# Patient Record
Sex: Male | Born: 1980
Health system: Southern US, Community
[De-identification: ages and names within clinical notes are randomized; demographics above are authoritative.]

---

## 2012-05-01 ENCOUNTER — Encounter: Payer: Self-pay | Admitting: Physician Assistant

## 2012-05-01 ENCOUNTER — Ambulatory Visit (INDEPENDENT_AMBULATORY_CARE_PROVIDER_SITE_OTHER): Payer: BC Managed Care – PPO | Admitting: Physician Assistant

## 2012-05-01 VITALS — BP 124/73 | HR 86 | Ht 69.0 in | Wt 253.0 lb

## 2012-05-01 DIAGNOSIS — Z7189 Other specified counseling: Secondary | ICD-10-CM

## 2012-05-01 DIAGNOSIS — Z716 Tobacco abuse counseling: Secondary | ICD-10-CM

## 2012-05-01 DIAGNOSIS — F17200 Nicotine dependence, unspecified, uncomplicated: Secondary | ICD-10-CM

## 2012-05-01 MED ORDER — BUPROPION HCL ER (SR) 150 MG PO TB12: 150.0000 mg | ORAL_TABLET | Freq: Two times a day (BID) | ORAL | Status: DC

## 2012-05-01 NOTE — Progress Notes (Signed)
  Subjective:    Patient ID: Brandon Baker, male    DOB: 03-29-1981, 31 y.o.   MRN: [id number]  HPI Patient is a 31 year old male who presents to the clinic to establish care. Past medical history was reviewed and negative for any ongoing chronic illnesses. He currently does not take any medication. He is unaware when his last tetanus shot was. He reports that he recently was seen by the doctor or physical and have fasting labs were normal. Surgical history was reviewed and negative for any past surgeries. He does have an extensive family history of high cholesterol, high blood pressure, diabetes and heart attack. He does admit to smoking a pack a day for the last 17 years along with chewing on a regular basis. He does want to talk about smoking cessation today. Patient has tried Chantix and did not like the medication nor do to help him stop smoking. He has also tried nicotine gum to find the headache to chew gum and hard to remember to use. She did stop smoking once for 3 months and cravings or so bad he started back again.   Review of Systems     Objective:   Physical Exam  Constitutional: He is oriented to person, place, and time. He appears well-developed and well-nourished.  HENT:  Head: Normocephalic and atraumatic.  Cardiovascular: Normal rate, regular rhythm and normal heart sounds.   Pulmonary/Chest: Effort normal and breath sounds normal. He has no wheezes.  Neurological: He is alert and oriented to person, place, and time.  Skin: Skin is warm and dry.  Psychiatric: He has a normal mood and affect. His behavior is normal.          Assessment & Plan:  Smoking cessation counseling- discuss with patient the importance of stopping smoking. Gave patient a handout on benefits of smoking cessation over time. We talked about B6 and switching from gross cigarettes to them. We also started on Wellbutrin to see if we could help with cravings and self control. We discussed the oral fixation  of smoking and how to replace it with something healthier. Encouraged patient to set small hard to feasible goals to obtain. Patient is aware that Wellbutrin is also use of antidepressant and if he notices any mood changes to call office and stop medication immediately. We did start him on 1 tab a day for 3 days then increase to twice a day. Patient aware that Wellbutrin does take some time to get into the system effectively and therapeutically at 4-6 weeks. Patient is to followup in office in 2 months. Short-term goal is to switch from cigarettes to e-cigs and 2 decrease have to number of times making a day. He will not start to eliminate you until cigarettes are completely gone.

## 2012-05-01 NOTE — Patient Instructions (Signed)
Smoking Cessation Quitting smoking is important to your health and has many advantages. However, it is not always easy to quit since nicotine is a very addictive drug. Often times, people try 3 times or more before being able to quit. This document explains the best ways for you to prepare to quit smoking. Quitting takes hard work and a lot of effort, but you can do it. ADVANTAGES OF QUITTING SMOKING  You will live longer, feel better, and live better.  Your body will feel the impact of quitting smoking almost immediately.  Within 20 minutes, blood pressure decreases. Your pulse returns to its normal level.  After 8 hours, carbon monoxide levels in the blood return to normal. Your oxygen level increases.  After 24 hours, the chance of having a heart attack starts to decrease. Your breath, hair, and body stop smelling like smoke.  After 48 hours, damaged nerve endings begin to recover. Your sense of taste and smell improve.  After 72 hours, the body is virtually free of nicotine. Your bronchial tubes relax and breathing becomes easier.  After 2 to 12 weeks, lungs can hold more air. Exercise becomes easier and circulation improves.  The risk of having a heart attack, stroke, cancer, or lung disease is greatly reduced.  After 1 year, the risk of coronary heart disease is cut in half.  After 5 years, the risk of stroke falls to the same as a nonsmoker.  After 10 years, the risk of lung cancer is cut in half and the risk of other cancers decreases significantly.  After 15 years, the risk of coronary heart disease drops, usually to the level of a nonsmoker.  If you are pregnant, quitting smoking will improve your chances of having a healthy baby.  The people you live with, especially any children, will be healthier.  You will have extra money to spend on things other than cigarettes. QUESTIONS TO THINK ABOUT BEFORE ATTEMPTING TO QUIT You may want to talk about your answers with your  caregiver.  Why do you want to quit?  If you tried to quit in the past, what helped and what did not?  What will be the most difficult situations for you after you quit? How will you plan to handle them?  Who can help you through the tough times? Your family? Friends? A caregiver?  What pleasures do you get from smoking? What ways can you still get pleasure if you quit? Here are some questions to ask your caregiver:  How can you help me to be successful at quitting?  What medicine do you think would be best for me and how should I take it?  What should I do if I need more help?  What is smoking withdrawal like? How can I get information on withdrawal? GET READY  Set a quit date.  Change your environment by getting rid of all cigarettes, ashtrays, matches, and lighters in your home, car, or work. Do not let people smoke in your home.  Review your past attempts to quit. Think about what worked and what did not. GET SUPPORT AND ENCOURAGEMENT You have a better chance of being successful if you have help. You can get support in many ways.  Tell your family, friends, and co-workers that you are going to quit and need their support. Ask them not to smoke around you.  Get individual, group, or telephone counseling and support. Programs are available at local hospitals and health centers. Call your local health department for   information about programs in your area.  Spiritual beliefs and practices may help some smokers quit.  Download a "quit meter" on your computer to keep track of quit statistics, such as how long you have gone without smoking, cigarettes not smoked, and money saved.  Get a self-help book about quitting smoking and staying off of tobacco. LEARN NEW SKILLS AND BEHAVIORS  Distract yourself from urges to smoke. Talk to someone, go for a walk, or occupy your time with a task.  Change your normal routine. Take a different route to work. Drink tea instead of coffee.  Eat breakfast in a different place.  Reduce your stress. Take a hot bath, exercise, or read a book.  Plan something enjoyable to do every day. Reward yourself for not smoking.  Explore interactive web-based programs that specialize in helping you quit. GET MEDICINE AND USE IT CORRECTLY Medicines can help you stop smoking and decrease the urge to smoke. Combining medicine with the above behavioral methods and support can greatly increase your chances of successfully quitting smoking.  Nicotine replacement therapy helps deliver nicotine to your body without the negative effects and risks of smoking. Nicotine replacement therapy includes nicotine gum, lozenges, inhalers, nasal sprays, and skin patches. Some may be available over-the-counter and others require a prescription.  Antidepressant medicine helps people abstain from smoking, but how this works is unknown. This medicine is available by prescription.  Nicotinic receptor partial agonist medicine simulates the effect of nicotine in your brain. This medicine is available by prescription. Ask your caregiver for advice about which medicines to use and how to use them based on your health history. Your caregiver will tell you what side effects to look out for if you choose to be on a medicine or therapy. Carefully read the information on the package. Do not use any other product containing nicotine while using a nicotine replacement product.  RELAPSE OR DIFFICULT SITUATIONS Most relapses occur within the first 3 months after quitting. Do not be discouraged if you start smoking again. Remember, most people try several times before finally quitting. You may have symptoms of withdrawal because your body is used to nicotine. You may crave cigarettes, be irritable, feel very hungry, cough often, get headaches, or have difficulty concentrating. The withdrawal symptoms are only temporary. They are strongest when you first quit, but they will go away within  10 14 days. To reduce the chances of relapse, try to:  Avoid drinking alcohol. Drinking lowers your chances of successfully quitting.  Reduce the amount of caffeine you consume. Once you quit smoking, the amount of caffeine in your body increases and can give you symptoms, such as a rapid heartbeat, sweating, and anxiety.  Avoid smokers because they can make you want to smoke.  Do not let weight gain distract you. Many smokers will gain weight when they quit, usually less than 10 pounds. Eat a healthy diet and stay active. You can always lose the weight gained after you quit.  Find ways to improve your mood other than smoking. FOR MORE INFORMATION  www.smokefree.gov  Document Released: 04/16/2001 Document Revised: 10/22/2011 Document Reviewed: 08/01/2011 [employer] Patient Information 2013 [employer].  

## 2012-05-07 ENCOUNTER — Encounter: Payer: Self-pay | Admitting: *Deleted

## 2012-05-07 ENCOUNTER — Telehealth: Payer: Self-pay | Admitting: Physician Assistant

## 2012-05-07 NOTE — Telephone Encounter (Signed)
Let patient know that there was no documentation of up to date Tdap or lipid profile. At next visit would like to get fasting labs. Last labs were done in 2012.

## 2012-05-07 NOTE — Telephone Encounter (Signed)
Pt.notified

## 2012-08-07 ENCOUNTER — Ambulatory Visit (INDEPENDENT_AMBULATORY_CARE_PROVIDER_SITE_OTHER): Payer: BC Managed Care – PPO | Admitting: Physician Assistant

## 2012-08-07 ENCOUNTER — Encounter: Payer: Self-pay | Admitting: Physician Assistant

## 2012-08-07 VITALS — BP 118/79 | HR 82 | Temp 97.7°F | Wt 269.0 lb

## 2012-08-07 DIAGNOSIS — F17200 Nicotine dependence, unspecified, uncomplicated: Secondary | ICD-10-CM

## 2012-08-07 DIAGNOSIS — Z3009 Encounter for other general counseling and advice on contraception: Secondary | ICD-10-CM

## 2012-08-07 DIAGNOSIS — J069 Acute upper respiratory infection, unspecified: Secondary | ICD-10-CM

## 2012-08-07 DIAGNOSIS — Z720 Tobacco use: Secondary | ICD-10-CM | POA: Insufficient documentation

## 2012-08-07 MED ORDER — NICOTINE 21 MG/24HR TD PT24: 1.0000 | MEDICATED_PATCH | TRANSDERMAL | Status: DC

## 2012-08-07 MED ORDER — AMOXICILLIN-POT CLAVULANATE 875-125 MG PO TABS: 1.0000 | ORAL_TABLET | Freq: Two times a day (BID) | ORAL | Status: DC

## 2012-08-07 MED ORDER — NICOTINE POLACRILEX 4 MG MT LOZG: 4.0000 mg | LOZENGE | OROMUCOSAL | Status: DC | PRN

## 2012-08-07 MED ORDER — NICOTINE 7 MG/24HR TD PT24: 1.0000 | MEDICATED_PATCH | TRANSDERMAL | Status: DC

## 2012-08-07 MED ORDER — NICOTINE 14 MG/24HR TD PT24: 1.0000 | MEDICATED_PATCH | TRANSDERMAL | Status: DC

## 2012-08-07 NOTE — Patient Instructions (Addendum)
Mucinex d twice a day drinking lots of water. Afrin 3-5 nasal spray.  Vitamin C and Zinc  Upper Respiratory Infection, Adult An upper respiratory infection (URI) is also known as the common cold. It is often caused by a type of germ (virus). Colds are easily spread (contagious). You can pass it to others by kissing, coughing, sneezing, or drinking out of the same glass. Usually, you get better in 1 or 2 weeks.  HOME CARE   Only take medicine as told by your doctor.  Use a warm mist humidifier or breathe in steam from a hot shower.  Drink enough water and fluids to keep your pee (urine) clear or pale yellow.  Get plenty of rest.  Return to work when your temperature is back to normal or as told by your doctor. You may use a face mask and wash your hands to stop your cold from spreading. GET HELP RIGHT AWAY IF:   After the first few days, you feel you are getting worse.  You have questions about your medicine.  You have chills, shortness of breath, or brown or red spit (mucus).  You have yellow or brown snot (nasal discharge) or pain in the face, especially when you bend forward.  You have a fever, puffy (swollen) neck, pain when you swallow, or white spots in the back of your throat.  You have a bad headache, ear pain, sinus pain, or chest pain.  You have a high-pitched whistling sound when you breathe in and out (wheezing).  You have a lasting cough or cough up blood.  You have sore muscles or a stiff neck. MAKE SURE YOU:   Understand these instructions.  Will watch your condition.  Will get help right away if you are not doing well or get worse. Document Released: 10/09/2007 Document Revised: 07/15/2011 Document Reviewed: 08/27/2010 [facility] Patient Information 2013 [place], [place].

## 2012-08-07 NOTE — Progress Notes (Signed)
  Subjective:    Patient ID: Brandon Baker, male    DOB: 1981/04/21, 32 y.o.   MRN: [id number]  HPI Patient presents to the clinic with sinus pressure, sore throat, nasal congestion. Pt would also like to talk about smoking cessation ideas.   Pt has had sinus pressure, sore throat, nasal congestion for that last 5 days. STarted with nasal congestion but progressed into sinus pressure and sore throat this morning. Feels very achy and weak. Denies any fever, chills, n/v/d, itchy watering eyes, or ear pain. Still able to eat and drink without difficulty. Denies any cough.  Pt has tried chantix, wellbutrin, e-cigs, hyponosis and all have not worked. He is down to half a pack from one pack daily. He also chews tobacco also. He going to switch to sunflower seeds and wants rx for patch and lozgeners to try.   Needs referral to get vasectomy.       Review of Systems     Objective:   Physical Exam  Constitutional: He is oriented to person, place, and time. He appears well-developed and well-nourished.  HENT:  Head: Normocephalic and atraumatic.  Right Ear: External ear normal.  Left Ear: External ear normal.  TM's clear bilaterally.   Tenderness to palpation over frontal sinuses. Turbinates red and swollen bilaterally.  Oropharynx red with PND. Gums erythematous.  Eyes:  conjunctivia injected and watery discharge bilaterally.  Neck: Normal range of motion. Neck supple.  Cardiovascular: Normal rate, regular rhythm and normal heart sounds.   Pulmonary/Chest: Effort normal and breath sounds normal. He has no wheezes.  Lymphadenopathy:    He has no cervical adenopathy.  Neurological: He is alert and oriented to person, place, and time.  Skin: Skin is warm and dry.  Psychiatric: He has a normal mood and affect. His behavior is normal.          Assessment & Plan:  URI- discussed symptomatic treatment and gave handout. Talked about mucinex d and afrin for next 2-3 days. Gave rx for  augmentin if not improving after about 2-3 more days. Discussed going on anti-histamine for allergy symptoms. Talked about vitamin C and zinc for immune boost.   Tobacco abuse- gave rx for nicotrol patches all three doses and lozengers. Discussed a plan of decrease before starting.  Follow up in 2 months.   Need for vasectomy- will refer to urology.   Spent 30 minutes with patient and greater than 50 percent of visit was spent counseling pt regarding smoking cessation.

## 2012-08-10 ENCOUNTER — Telehealth: Payer: Self-pay | Admitting: *Deleted

## 2012-08-10 NOTE — Telephone Encounter (Signed)
Pharmacist calls and states because of insurance they need the frequency of the lozenges to be used. Also verify reason on the use of the Nicotine Patch in combo with the lozenges as the insurance does not recommend it

## 2012-08-14 ENCOUNTER — Ambulatory Visit: Payer: BC Managed Care – PPO | Admitting: Physician Assistant

## 2012-08-19 NOTE — Telephone Encounter (Signed)
Call Pt and see which one he would like to try first? Gum or patch.

## 2012-08-19 NOTE — Telephone Encounter (Signed)
LMOM of information and instructed pt to call back with which he prefers. Barry Dienes, LPN

## 2013-09-14 ENCOUNTER — Ambulatory Visit (INDEPENDENT_AMBULATORY_CARE_PROVIDER_SITE_OTHER): Payer: Federal, State, Local not specified - PPO | Admitting: Family Medicine

## 2013-09-14 ENCOUNTER — Encounter: Payer: Self-pay | Admitting: Family Medicine

## 2013-09-14 VITALS — BP 142/88 | HR 88 | Temp 98.2°F | Wt 258.0 lb

## 2013-09-14 DIAGNOSIS — J020 Streptococcal pharyngitis: Secondary | ICD-10-CM

## 2013-09-14 MED ORDER — CEFDINIR 300 MG PO CAPS: 300.0000 mg | ORAL_CAPSULE | Freq: Two times a day (BID) | ORAL | Status: AC

## 2013-09-14 NOTE — Progress Notes (Signed)
CC: Brandon Baker Digilio is a 33 y.o. male is here for Sore Throat   Subjective: HPI:  Complains of sore throat that is localized to the right posterior throat radiated into the right ear present when swallowing. Has been present since Thursday of last week. Fairly improved since starting amoxicillin on Saturday however fevers with maximum temperature of 102.0 have resolved as of Sunday morning. He was prescribed amoxicillin and a Medrol Dosepak which she is taking as prescribed. States that he feels tired, rundown, and surprised that he is not feeling much better by now.  No interventions other than above. No sick contacts. He denies choking, shortness of breath, cough, wheezing, facial pressure, runny nose, ocular complaints, hearing loss, tinnitus, rash, nor headache.   Review Of Systems Outlined In HPI  No past medical history on file.  No past surgical history on file. Family History  Problem Relation Age of Onset  . Alcohol abuse Mother   . Alcohol abuse Father   . Heart attack Maternal Uncle   . Hypertension Maternal Uncle   . Diabetes Maternal Grandmother   . Hyperlipidemia Maternal Grandmother   . Hypertension Maternal Grandmother   . Heart attack Maternal Grandfather   . Diabetes Maternal Grandfather   . Hyperlipidemia Maternal Grandfather   . Hypertension Maternal Grandfather     History   Social History  . Marital Status: Single    Spouse Name: N/A    Number of Children: N/A  . Years of Education: N/A   Occupational History  . Not on file.   Social History Main Topics  . Smoking status: Current Every Day Smoker -- 1.00 packs/day    Types: Cigarettes  . Smokeless tobacco: Not on file  . Alcohol Use: Not on file  . Drug Use: Not on file  . Sexual Activity: Not on file   Other Topics Concern  . Not on file   Social History Narrative  . No narrative on file     Objective: BP 142/88  Pulse 88  Temp(Src) 98.2 F (36.8 C) (Oral)  Wt 258 lb (117.028  kg)  General: Alert and Oriented, No Acute Distress HEENT: Pupils equal, round, reactive to light. Conjunctivae clear.  External ears unremarkable, canals clear with intact TMs with appropriate landmarks.  Middle ear appears open without effusion. Pink inferior turbinates.  Moist mucous membranes, pharynx with moderate erythema and moderate white exudate on the right tonsil, uvula is midline.  Neck supple without palpable lymphadenopathy nor abnormal masses. No posterior cervical chain lymphadenopathy Lungs: Clear to auscultation bilaterally, no wheezing/ronchi/rales.  Comfortable work of breathing. Good air movement. Extremities: No peripheral edema.  Strong peripheral pulses.  Mental Status: No depression, anxiety, nor agitation. Skin: Warm and dry.  Assessment & Plan: Lorin PicketScott was seen today for sore throat.  Diagnoses and associated orders for this visit:  Strep pharyngitis - cefdinir (OMNICEF) 300 MG capsule; Take 1 capsule (300 mg total) by mouth 2 (two) times daily.    Strep pharyngitis: Exam is consistent with antibiotic failure of amoxicillin for strep pharyngitis therefore stop this medication and start Omnicef instead, continue on Medrol Dosepak. Consider 800 mg ibuprofen every 8 hours for pain control.  Was given Rocephin 250 mg today in clinic to help expedite resolution.Signs and symptoms with emphasis on position of uvula requring emergent/urgent reevaluation were discussed with the patient.   Return if symptoms worsen or fail to improve.

## 2013-09-15 MED ORDER — CEFTRIAXONE SODIUM 1 G IJ SOLR
250.0000 mg | Freq: Once | INTRAMUSCULAR | Status: AC
  Administered 2013-09-15: 250 mg via INTRAMUSCULAR

## 2013-09-15 NOTE — Addendum Note (Signed)
Addended by: Avon GullyMCCRIMMON, Lorri Fukuhara C on: 09/15/2013 12:04 PM   Modules accepted: Orders

## 2014-05-30 ENCOUNTER — Encounter: Payer: Self-pay | Admitting: Physician Assistant

## 2014-05-30 ENCOUNTER — Ambulatory Visit (INDEPENDENT_AMBULATORY_CARE_PROVIDER_SITE_OTHER): Payer: Federal, State, Local not specified - PPO | Admitting: Physician Assistant

## 2014-05-30 VITALS — BP 153/92 | HR 97 | Temp 97.6°F | Wt 269.0 lb

## 2014-05-30 DIAGNOSIS — J069 Acute upper respiratory infection, unspecified: Secondary | ICD-10-CM

## 2014-05-30 MED ORDER — AMOXICILLIN-POT CLAVULANATE 875-125 MG PO TABS: 1.0000 | ORAL_TABLET | Freq: Two times a day (BID) | ORAL | Status: DC

## 2014-05-30 NOTE — Patient Instructions (Signed)
Consider staying on mucinex.  flonase 2 sprays.

## 2014-05-30 NOTE — Progress Notes (Signed)
   Subjective:    Patient ID: Brandon Baker, male    DOB: 10/02/1980, 34 y.o.   MRN: [id number]  HPI  Pt is a 34 yo male who presents to the clinic with 5 days of congestion, cough, ST, headache and ear pressure. Symptoms seem to be getting worse. Ran a low grade fever this weekend at 100. Cough is productive and worse at night. OTC decongestant, cough syrup and advil tylenol cold and flu.     Review of Systems  All other systems reviewed and are negative.      Objective:   Physical Exam  Constitutional: He is oriented to person, place, and time. He appears well-developed and well-nourished.  HENT:  Head: Normocephalic and atraumatic.  TM's erythematous with dullness to membrane. Not able to view ossicles. No active drainage or pus.   Maxillary pressure to palpation bilaterally.   Nasal turbinates red and swollen.   Oropharynx erythematous with PND, tonsils normal and without exudate.   Eyes: Conjunctivae are normal.  Neck: Normal range of motion. Neck supple.  Cardiovascular: Normal rate, regular rhythm and normal heart sounds.   Pulmonary/Chest: Effort normal and breath sounds normal. He has no wheezes.  Lymphadenopathy:    He has no cervical adenopathy.  Neurological: He is alert and oriented to person, place, and time.  Psychiatric: He has a normal mood and affect. His behavior is normal.          Assessment & Plan:  Acute  Upper respiratory infection- given augmentin for 10 days. Discussed delsym for cough. Can use mucinex to help loosen sputum. flonase for any nasal congestion. HO given. Follow up as needed.

## 2014-05-31 ENCOUNTER — Other Ambulatory Visit: Payer: Self-pay | Admitting: Physician Assistant

## 2014-05-31 ENCOUNTER — Telehealth: Payer: Self-pay | Admitting: *Deleted

## 2014-05-31 NOTE — Telephone Encounter (Signed)
Pt notified of recommendations

## 2014-05-31 NOTE — Telephone Encounter (Signed)
All prescription cough syrups have hydrocodone or codiene. This is on his allergy list. Best thing to try is honey and lemon juice compound or delsym cough syrup.

## 2014-05-31 NOTE — Telephone Encounter (Signed)
Pt left vm stating that he would like a cough syrup prescribed.  He stated that he didn't get much sleep last night.

## 2014-06-06 ENCOUNTER — Ambulatory Visit (INDEPENDENT_AMBULATORY_CARE_PROVIDER_SITE_OTHER): Payer: Federal, State, Local not specified - PPO | Admitting: Physician Assistant

## 2014-06-06 ENCOUNTER — Encounter: Payer: Self-pay | Admitting: Physician Assistant

## 2014-06-06 VITALS — BP 132/81 | HR 80 | Temp 97.8°F | Wt 269.0 lb

## 2014-06-06 DIAGNOSIS — J0110 Acute frontal sinusitis, unspecified: Secondary | ICD-10-CM

## 2014-06-06 MED ORDER — HYDROCODONE-HOMATROPINE 5-1.5 MG/5ML PO SYRP: 5.0000 mL | ORAL_SOLUTION | Freq: Every evening | ORAL | Status: DC | PRN

## 2014-06-06 MED ORDER — AZITHROMYCIN 250 MG PO TABS: ORAL_TABLET | ORAL | Status: DC

## 2014-06-06 MED ORDER — PREDNISONE 50 MG PO TABS: ORAL_TABLET | ORAL | Status: DC

## 2014-06-06 NOTE — Progress Notes (Signed)
   Subjective:    Patient ID: Brandon Baker, male    DOB: 04/13/1981, 34 y.o.   MRN: [id number]  HPI  Patient is a 34 year old male who was seen last week with an acute respiratory infection. He was given Augmentin and told to take Mucinex. He feels like his productive cough is improving but his frontal sinus pressure and dry cough is worsening. He coughs all day long but seems worse around 6:00 in the evening. He denies any wheezing or shortness of breath. He denies any fever, body aches or chills. He has a lot of head pressure and ear pain. He is starting to his Augmentin. He has continued on Mucinex past months as well as Robitussin.. Patient is a current smoker   Review of Systems  All other systems reviewed and are negative.      Objective:   Physical Exam  Constitutional: He is oriented to person, place, and time. He appears well-developed and well-nourished.  HENT:  Head: Normocephalic and atraumatic.  Right Ear: External ear normal.  Left Ear: External ear normal.  Left and right TMs appear slightly dull. No blood, pus or erythema.  Bilateral frontal sinus tenderness to palpation.  Oropharynx erythematous with no tonsillar swelling or exudate. There is some postnasal drip present.  Bilateral turbinates red and swollen bilaterally.  Eyes: Conjunctivae are normal. Right eye exhibits no discharge. Left eye exhibits no discharge.  Neck: Normal range of motion. Neck supple.  Cardiovascular: Normal rate, regular rhythm and normal heart sounds.   Pulmonary/Chest: Effort normal and breath sounds normal. He has no wheezes.  Lymphadenopathy:    He has no cervical adenopathy.  Neurological: He is alert and oriented to person, place, and time.  Skin: Skin is dry.  Psychiatric: He has a normal mood and affect. His behavior is normal.          Assessment & Plan:  Acute frontal sinusitis-with postnasal drip is causing a cough along with some postinflammatory causes. Discussed  contraindication with past allergy to Lortab to cough syrups. He would like to try. He is unsure if that is an actual allergy. Since Hycodan for bedtime use to pharmacy. Stop Augmentin. Start azithromycin. Start prednisone as well for 5 days. Call if not improving or symptoms worsening.

## 2014-11-24 ENCOUNTER — Ambulatory Visit (INDEPENDENT_AMBULATORY_CARE_PROVIDER_SITE_OTHER): Payer: Federal, State, Local not specified - PPO | Admitting: Family Medicine

## 2014-11-24 ENCOUNTER — Encounter: Payer: Self-pay | Admitting: Family Medicine

## 2014-11-24 VITALS — BP 138/81 | HR 82 | Wt 277.0 lb

## 2014-11-24 DIAGNOSIS — B880 Other acariasis: Secondary | ICD-10-CM | POA: Insufficient documentation

## 2014-11-24 MED ORDER — PREDNISONE 5 MG (48) PO TBPK: 5.0000 mg | ORAL_TABLET | Freq: Every day | ORAL | Status: DC

## 2014-11-24 MED ORDER — FLUOCINOLONE ACETONIDE 0.025 % EX OINT: TOPICAL_OINTMENT | Freq: Two times a day (BID) | CUTANEOUS | Status: DC

## 2014-11-24 NOTE — Progress Notes (Signed)
Brandon Baker is a 34 y.o. male who presents to [facility] [place]  today for rash. Patient has multiple small erythematous pruritic papules on both lower extremities. This is been present a few days after walking any grassy field. He thinks perhaps he has chigger bite. He has not tried any treatment yet. No fevers chills nausea vomiting or diarrhea. No new medications of detergents shampoos cosmetics etc.   No past medical history on file. No past surgical history on file. History  Substance Use Topics  . Smoking status: Current Every Day Smoker -- 1.00 packs/day    Types: Cigarettes  . Smokeless tobacco: Not on file  . Alcohol Use: Not on file   ROS as above Medications: Current Outpatient Prescriptions  Medication Sig Dispense Refill  . fluocinolone (SYNALAR) 0.025 % ointment Apply topically 2 (two) times daily. 60 g 1  . predniSONE (STERAPRED UNI-PAK 48 TAB) 5 MG (48) TBPK tablet Take 1 tablet (5 mg total) by mouth daily. 12 day dosepack po 48 tablet 0   No current facility-administered medications for this visit.   Allergies  Allergen Reactions  . Lortab [Hydrocodone-Acetaminophen] Other (See Comments)    Sweating and dizziness     Exam:  BP 138/81 mmHg  Pulse 82  Wt 277 lb (125.646 kg) Gen: Well NAD HEENT: EOMI,  MMM Lungs: Normal work of breathing. CTABL Heart: RRR no MRG Abd: NABS, Soft. Nondistended, Nontender Exts: Brisk capillary refill, warm and well perfused.  Skin: Small erythematous follicular papules both lower extremities. Blanchable. Nontender.  No results found for this or any previous visit (from the past 24 hour(s)). No results found.   Please see individual assessment and plan sections.

## 2014-11-24 NOTE — Patient Instructions (Signed)
Thank you for coming in today. Call or go to the emergency room if you get worse, have trouble breathing, have chest pains, or palpitations.  Use the ointment as needed.  Take prednisone if not better.   Insect Bite Mosquitoes, flies, fleas, bedbugs, and many other insects can bite. Insect bites are different from insect stings. A sting is when venom is injected into the skin. Some insect bites can transmit infectious diseases. SYMPTOMS  Insect bites usually turn red, swell, and itch for 2 to 4 days. They often go away on their own. TREATMENT  Your caregiver may prescribe antibiotic medicines if a bacterial infection develops in the bite. HOME CARE INSTRUCTIONS  Do not scratch the bite area.  Keep the bite area clean and dry. Wash the bite area thoroughly with soap and water.  Put ice or cool compresses on the bite area.  Put ice in a plastic bag.  Place a towel between your skin and the bag.  Leave the ice on for 20 minutes, 4 times a day for the first 2 to 3 days, or as directed.  You may apply a baking soda paste, cortisone cream, or calamine lotion to the bite area as directed by your caregiver. This can help reduce itching and swelling.  Only take over-the-counter or prescription medicines as directed by your caregiver.  If you are given antibiotics, take them as directed. Finish them even if you start to feel better. You may need a tetanus shot if:  You cannot remember when you had your last tetanus shot.  You have never had a tetanus shot.  The injury broke your skin. If you get a tetanus shot, your arm may swell, get red, and feel warm to the touch. This is common and not a problem. If you need a tetanus shot and you choose not to have one, there is a rare chance of getting tetanus. Sickness from tetanus can be serious. SEEK IMMEDIATE MEDICAL CARE IF:   You have increased pain, redness, or swelling in the bite area.  You see a red line on the skin coming from the  bite.  You have a fever.  You have joint pain.  You have a headache or neck pain.  You have unusual weakness.  You have a rash.  You have chest pain or shortness of breath.  You have abdominal pain, nausea, or vomiting.  You feel unusually tired or sleepy. MAKE SURE YOU:   Understand these instructions.  Will watch your condition.  Will get help right away if you are not doing well or get worse. Document Released: 05/30/2004 Document Revised: 07/15/2011 Document Reviewed: 11/21/2010 [facility] Patient Information 2015 [place], [place]. This information is not intended to replace advice given to you by your health care provider. Make sure you discuss any questions you have with your health care provider.

## 2014-11-24 NOTE — Assessment & Plan Note (Signed)
Likely chigger bites. Treat with steroid ointment and use prednisone if not better.

## 2015-07-29 ENCOUNTER — Emergency Department
Admission: EM | Admit: 2015-07-29 | Discharge: 2015-07-29 | Disposition: A | Discharge status: Home / Self Care | Payer: Federal, State, Local not specified - PPO | Source: Home / Self Care | Attending: Family Medicine | Admitting: Family Medicine

## 2015-07-29 DIAGNOSIS — M5442 Lumbago with sciatica, left side: Secondary | ICD-10-CM

## 2015-07-29 DIAGNOSIS — S39012A Strain of muscle, fascia and tendon of lower back, initial encounter: Secondary | ICD-10-CM

## 2015-07-29 MED ORDER — METHYLPREDNISOLONE SODIUM SUCC 40 MG IJ SOLR
80.0000 mg | Freq: Once | INTRAMUSCULAR | Status: AC
  Administered 2015-07-29: 80 mg via INTRAMUSCULAR

## 2015-07-29 MED ORDER — TRAMADOL HCL 50 MG PO TABS: 50.0000 mg | ORAL_TABLET | Freq: Four times a day (QID) | ORAL | Status: DC | PRN

## 2015-07-29 MED ORDER — METHOCARBAMOL 500 MG PO TABS: 500.0000 mg | ORAL_TABLET | Freq: Two times a day (BID) | ORAL | Status: DC

## 2015-07-29 MED ORDER — IBUPROFEN 600 MG PO TABS: 600.0000 mg | ORAL_TABLET | Freq: Four times a day (QID) | ORAL | Status: DC | PRN

## 2015-07-29 NOTE — ED Provider Notes (Signed)
CSN: [id number]     Arrival date & time 07/29/15  1428 History   First MD Initiated Contact with Patient 07/29/15 1531     Chief Complaint  Patient presents with  . Back Pain   (Consider location/radiation/quality/duration/timing/severity/associated sxs/prior Treatment) HPI The pt is a 35yo male presenting to [facility] with c/o sudden onset, waxing and waning Left lower back pain that started around 9AM this morning. Pt was trying to carry his toddler who "threw a fit" causing pt to injure his back.  Pain is aching and sharp, radiating to the lateral aspect of his Left upper thigh.  Denies numbness or tingling in legs or groin. Denies change in bowel or bladder habits. No prior hx of back surgeries or injuries.  He did take ibuprofen earlier but only provided minimal relief.  History reviewed. No pertinent past medical history. History reviewed. No pertinent past surgical history. Family History  Problem Relation Age of Onset  . Alcohol abuse Mother   . Alcohol abuse Father   . Heart attack Maternal Uncle   . Hypertension Maternal Uncle   . Diabetes Maternal Grandmother   . Hyperlipidemia Maternal Grandmother   . Hypertension Maternal Grandmother   . Heart attack Maternal Grandfather   . Diabetes Maternal Grandfather   . Hyperlipidemia Maternal Grandfather   . Hypertension Maternal Grandfather    Social History  Substance Use Topics  . Smoking status: Current Every Day Smoker -- 1.00 packs/day    Types: Cigarettes  . Smokeless tobacco: None  . Alcohol Use: None    Review of Systems  Constitutional: Negative for fever and chills.  Gastrointestinal: Negative for nausea, vomiting and abdominal pain.  Musculoskeletal: Positive for myalgias and back pain. Negative for joint swelling, arthralgias and gait problem.  Skin: Negative for color change, rash and wound.  Neurological: Negative for weakness and numbness.    Allergies  Lortab  Home Medications   Prior to Admission  medications   Medication Sig Start Date End Date Taking? Authorizing Provider  fluocinolone (SYNALAR) 0.025 % ointment Apply topically 2 (two) times daily. 11/24/14   Rodolph Bong, MD  ibuprofen (ADVIL,MOTRIN) 600 MG tablet Take 1 tablet (600 mg total) by mouth every 6 (six) hours as needed. 07/29/15   Junius Finner, PA-C  methocarbamol (ROBAXIN) 500 MG tablet Take 1 tablet (500 mg total) by mouth 2 (two) times daily. 07/29/15   Junius Finner, PA-C  predniSONE (STERAPRED UNI-PAK 48 TAB) 5 MG (48) TBPK tablet Take 1 tablet (5 mg total) by mouth daily. 12 day dosepack po 11/24/14   Rodolph Bong, MD  traMADol (ULTRAM) 50 MG tablet Take 1 tablet (50 mg total) by mouth every 6 (six) hours as needed. 07/29/15   Junius Finner, PA-C   Meds Ordered and Administered this Visit   Medications  methylPREDNISolone sodium succinate (SOLU-MEDROL) 40 mg/mL injection 80 mg (80 mg Intramuscular Given 07/29/15 1556)    BP 115/72 mmHg  Pulse 67  Temp(Src) 98.1 F (36.7 C) (Oral)  Ht  (1.778 m)  Wt 250 lb 12 oz (113.739 kg)  BMI 35.98 kg/m2  SpO2 99% No data found.   Physical Exam  Constitutional: He is oriented to person, place, and time. He appears well-developed and well-nourished.  HENT:  Head: Normocephalic and atraumatic.  Eyes: EOM are normal.  Neck: Normal range of motion.  Cardiovascular: Normal rate.   Pulmonary/Chest: Effort normal.  Musculoskeletal: Normal range of motion. He exhibits tenderness. He exhibits no edema.  No midline  spinal tenderness. Full ROM upper and lower extremities with 5/5 strength bilaterally. Tenderness to Left lower lumbar muscles and Left buttock.   Neurological: He is alert and oriented to person, place, and time.  Antalgic gait. Increased pain with change in positions from sitting to standing.   Skin: Skin is warm and dry. No rash noted. No erythema.  Psychiatric: He has a normal mood and affect. His behavior is normal.  Nursing note and vitals  reviewed.   ED Course  Procedures (including critical care time)  Labs Review Labs Reviewed - No data to display  Imaging Review No results found.    MDM   1. Low back strain, initial encounter   2. Left-sided low back pain with left-sided sciatica    Pt c/o Left lower back pain after carrying his toddler earlier this morning.  No red flag symptoms. Reproducible tenderness to lumbar muscles with palpation.  Discussed plain films, would likely not be of benefit at this time.  Tx in UC: Solumedrol 80mg  IM  Rx: Tramadol, robaxin, and ibuprofen  Discussed alternating ice and heat. Gentle stretches as pain subsides. F/u with PCP in 4-5 days if not improving, sooner if worsening. Patient verbalized understanding and agreement with treatment plan.     Junius Finnerrin O'Malley, PA-C 07/29/15 [phone]

## 2015-07-29 NOTE — Discharge Instructions (Signed)
Tramadol is strong pain medication. While taking, do not drink alcohol, drive, or perform any other activities that requires focus while taking these medications.   Robaxin is a muscle relaxer and may cause drowsiness. Do not drink alcohol, drive, or operate heavy machinery while taking.  You may use ice packs 2-3 times a day and/or alternate with warm compresses such as a heating pad or warm wash cloth 2-3 times a day.  As initial pain subsides, you may do gentle stretching exercises shown below.

## 2015-07-29 NOTE — ED Notes (Signed)
Was picking up toddler this am around 9 am.  Twisted back, had instant stabbing pain.  Since it has been waxing and waning.  Hurts mostly when moving moving to the right, but hurts in lower left and down into hip

## 2015-07-30 ENCOUNTER — Telehealth: Payer: Self-pay | Admitting: Emergency Medicine

## 2015-07-30 MED ORDER — HYDROCODONE-ACETAMINOPHEN 5-325 MG PO TABS: 1.0000 | ORAL_TABLET | Freq: Four times a day (QID) | ORAL | Status: DC | PRN

## 2015-07-31 ENCOUNTER — Ambulatory Visit (INDEPENDENT_AMBULATORY_CARE_PROVIDER_SITE_OTHER): Payer: Federal, State, Local not specified - PPO | Admitting: Physician Assistant

## 2015-07-31 ENCOUNTER — Encounter: Payer: Self-pay | Admitting: Physician Assistant

## 2015-07-31 VITALS — BP 108/73 | HR 76 | Ht 70.0 in | Wt 254.0 lb

## 2015-07-31 DIAGNOSIS — M62830 Muscle spasm of back: Secondary | ICD-10-CM

## 2015-07-31 DIAGNOSIS — S39012D Strain of muscle, fascia and tendon of lower back, subsequent encounter: Secondary | ICD-10-CM | POA: Diagnosis not present

## 2015-07-31 MED ORDER — PREDNISONE 50 MG PO TABS: ORAL_TABLET | ORAL | Status: DC

## 2015-07-31 MED ORDER — KETOROLAC TROMETHAMINE 60 MG/2ML IM SOLN
60.0000 mg | Freq: Once | INTRAMUSCULAR | Status: AC
  Administered 2015-07-31: 60 mg via INTRAMUSCULAR

## 2015-07-31 MED ORDER — HYDROCODONE-ACETAMINOPHEN 5-325 MG PO TABS: 1.0000 | ORAL_TABLET | Freq: Four times a day (QID) | ORAL | Status: DC | PRN

## 2015-07-31 NOTE — Patient Instructions (Signed)
Low Back Sprain With Rehab A sprain is an injury in which a ligament is torn. The ligaments of the lower back are vulnerable to sprains. However, they are strong and require great force to be injured. These ligaments are important for stabilizing the spinal column. Sprains are classified into three categories. Grade 1 sprains cause pain, but the tendon is not lengthened. Grade 2 sprains include a lengthened ligament, due to the ligament being stretched or partially ruptured. With grade 2 sprains there is still function, although the function may be decreased. Grade 3 sprains involve a complete tear of the tendon or muscle, and function is usually impaired. SYMPTOMS   Severe pain in the lower back.  Sometimes, a feeling of a "pop," "snap," or tear, at the time of injury.  Tenderness and sometimes swelling at the injury site.  Uncommonly, bruising (contusion) within 48 hours of injury.  Muscle spasms in the back. CAUSES  Low back sprains occur when a force is placed on the ligaments that is greater than they can handle. Common causes of injury include:  Performing a stressful act while off-balance.  Repetitive stressful activities that involve movement of the lower back.  Direct hit (trauma) to the lower back. RISK INCREASES WITH:  Contact sports (football, wrestling).  Collisions (major skiing accidents).  Sports that require throwing or lifting (baseball, weightlifting).  Sports involving twisting of the spine (gymnastics, diving, tennis, golf).  Poor strength and flexibility.  Inadequate protection.  Previous back injury or surgery (especially fusion). PREVENTION  Wear properly fitted and padded protective equipment.  Warm up and stretch properly before activity.  Allow for adequate recovery between workouts.  Maintain physical fitness:  Strength, flexibility, and endurance.  Cardiovascular fitness.  Maintain a healthy body weight. PROGNOSIS  If treated properly,  low back sprains usually heal with non-surgical treatment. The length of time for healing depends on the severity of the injury.  RELATED COMPLICATIONS   Recurring symptoms, resulting in a chronic problem.  Chronic inflammation and pain in the low back.  Delayed healing or resolution of symptoms, especially if activity is resumed too soon.  Prolonged impairment.  Unstable or arthritic joints of the low back. TREATMENT  Treatment first involves the use of ice and medicine, to reduce pain and inflammation. The use of strengthening and stretching exercises may help reduce pain with activity. These exercises may be performed at home or with a therapist. Severe injuries may require referral to a therapist for further evaluation and treatment, such as ultrasound. Your caregiver may advise that you wear a back brace or corset, to help reduce pain and discomfort. Often, prolonged bed rest results in greater harm then benefit. Corticosteroid injections may be recommended. However, these should be reserved for the most serious cases. It is important to avoid using your back when lifting objects. At night, sleep on your back on a firm mattress, with a pillow placed under your knees. If non-surgical treatment is unsuccessful, surgery may be needed.  MEDICATION   If pain medicine is needed, nonsteroidal anti-inflammatory medicines (aspirin and ibuprofen), or other minor pain relievers (acetaminophen), are often advised.  Do not take pain medicine for 7 days before surgery.  Prescription pain relievers may be given, if your caregiver thinks they are needed. Use only as directed and only as much as you need.  Ointments applied to the skin may be helpful.  Corticosteroid injections may be given by your caregiver. These injections should be reserved for the most serious cases, because   they may only be given a certain number of times. HEAT AND COLD  Cold treatment (icing) should be applied for 10 to 15  minutes every 2 to 3 hours for inflammation and pain, and immediately after activity that aggravates your symptoms. Use ice packs or an ice massage.  Heat treatment may be used before performing stretching and strengthening activities prescribed by your caregiver, physical therapist, or athletic trainer. Use a heat pack or a warm water soak. SEEK MEDICAL CARE IF:   Symptoms get worse or do not improve in 2 to 4 weeks, despite treatment.  You develop numbness or weakness in either leg.  You lose bowel or bladder function.  Any of the following occur after surgery: fever, increased pain, swelling, redness, drainage of fluids, or bleeding in the affected area.  New, unexplained symptoms develop. (Drugs used in treatment may produce side effects.) EXERCISES  RANGE OF MOTION (ROM) AND STRETCHING EXERCISES - Low Back Sprain Most people with lower back pain will find that their symptoms get worse with excessive bending forward (flexion) or arching at the lower back (extension). The exercises that will help resolve your symptoms will focus on the opposite motion.  Your physician, physical therapist or athletic trainer will help you determine which exercises will be most helpful to resolve your lower back pain. Do not complete any exercises without first consulting with your caregiver. Discontinue any exercises which make your symptoms worse, until you speak to your caregiver. If you have pain, numbness or tingling which travels down into your buttocks, leg or foot, the goal of the therapy is for these symptoms to move closer to your back and eventually resolve. Sometimes, these leg symptoms will get better, but your lower back pain may worsen. This is often an indication of progress in your rehabilitation. Be very alert to any changes in your symptoms and the activities in which you participated in the 24 hours prior to the change. Sharing this information with your caregiver will allow him or her to most  efficiently treat your condition. These exercises may help you when beginning to rehabilitate your injury. Your symptoms may resolve with or without further involvement from your physician, physical therapist or athletic trainer. While completing these exercises, remember:   Restoring tissue flexibility helps normal motion to return to the joints. This allows healthier, less painful movement and activity.  An effective stretch should be held for at least 30 seconds.  A stretch should never be painful. You should only feel a gentle lengthening or release in the stretched tissue. FLEXION RANGE OF MOTION AND STRETCHING EXERCISES: STRETCH - Flexion, Single Knee to Chest   Lie on a firm bed or floor with both legs extended in front of you.  Keeping one leg in contact with the floor, bring your opposite knee to your chest. Hold your leg in place by either grabbing behind your thigh or at your knee.  Pull until you feel a gentle stretch in your low back. Hold __________ seconds.  Slowly release your grasp and repeat the exercise with the opposite side. Repeat __________ times. Complete this exercise __________ times per day.  STRETCH - Flexion, Double Knee to Chest  Lie on a firm bed or floor with both legs extended in front of you.  Keeping one leg in contact with the floor, bring your opposite knee to your chest.  Tense your stomach muscles to support your back and then lift your other knee to your chest. Hold your legs in   place by either grabbing behind your thighs or at your knees.  Pull both knees toward your chest until you feel a gentle stretch in your low back. Hold __________ seconds.  Tense your stomach muscles and slowly return one leg at a time to the floor. Repeat __________ times. Complete this exercise __________ times per day.  STRETCH - Low Trunk Rotation  Lie on a firm bed or floor. Keeping your legs in front of you, bend your knees so they are both pointed toward the  ceiling and your feet are flat on the floor.  Extend your arms out to the side. This will stabilize your upper body by keeping your shoulders in contact with the floor.  Gently and slowly drop both knees together to one side until you feel a gentle stretch in your low back. Hold for __________ seconds.  Tense your stomach muscles to support your lower back as you bring your knees back to the starting position. Repeat the exercise to the other side. Repeat __________ times. Complete this exercise __________ times per day  EXTENSION RANGE OF MOTION AND FLEXIBILITY EXERCISES: STRETCH - Extension, Prone on Elbows   Lie on your stomach on the floor, a bed will be too soft. Place your palms about shoulder width apart and at the height of your head.  Place your elbows under your shoulders. If this is too painful, stack pillows under your chest.  Allow your body to relax so that your hips drop lower and make contact more completely with the floor.  Hold this position for __________ seconds.  Slowly return to lying flat on the floor. Repeat __________ times. Complete this exercise __________ times per day.  RANGE OF MOTION - Extension, Prone Press Ups  Lie on your stomach on the floor, a bed will be too soft. Place your palms about shoulder width apart and at the height of your head.  Keeping your back as relaxed as possible, slowly straighten your elbows while keeping your hips on the floor. You may adjust the placement of your hands to maximize your comfort. As you gain motion, your hands will come more underneath your shoulders.  Hold this position __________ seconds.  Slowly return to lying flat on the floor. Repeat __________ times. Complete this exercise __________ times per day.  RANGE OF MOTION- Quadruped, Neutral Spine   Assume a hands and knees position on a firm surface. Keep your hands under your shoulders and your knees under your hips. You may place padding under your knees for  comfort.  Drop your head and point your tailbone toward the ground below you. This will round out your lower back like an angry cat. Hold this position for __________ seconds.  Slowly lift your head and release your tail bone so that your back sags into a large arch, like an old horse.  Hold this position for __________ seconds.  Repeat this until you feel limber in your low back.  Now, find your "sweet spot." This will be the most comfortable position somewhere between the two previous positions. This is your neutral spine. Once you have found this position, tense your stomach muscles to support your low back.  Hold this position for __________ seconds. Repeat __________ times. Complete this exercise __________ times per day.  STRENGTHENING EXERCISES - Low Back Sprain These exercises may help you when beginning to rehabilitate your injury. These exercises should be done near your "sweet spot." This is the neutral, low-back arch, somewhere between fully rounded and   fully arched, that is your least painful position. When performed in this safe range of motion, these exercises can be used for people who have either a flexion or extension based injury. These exercises may resolve your symptoms with or without further involvement from your physician, physical therapist or athletic trainer. While completing these exercises, remember:   Muscles can gain both the endurance and the strength needed for everyday activities through controlled exercises.  Complete these exercises as instructed by your physician, physical therapist or athletic trainer. Increase the resistance and repetitions only as guided.  You may experience muscle soreness or fatigue, but the pain or discomfort you are trying to eliminate should never worsen during these exercises. If this pain does worsen, stop and make certain you are following the directions exactly. If the pain is still present after adjustments, discontinue the  exercise until you can discuss the trouble with your caregiver. STRENGTHENING - Deep Abdominals, Pelvic Tilt   Lie on a firm bed or floor. Keeping your legs in front of you, bend your knees so they are both pointed toward the ceiling and your feet are flat on the floor.  Tense your lower abdominal muscles to press your low back into the floor. This motion will rotate your pelvis so that your tail bone is scooping upwards rather than pointing at your feet or into the floor. With a gentle tension and even breathing, hold this position for __________ seconds. Repeat __________ times. Complete this exercise __________ times per day.  STRENGTHENING - Abdominals, Crunches   Lie on a firm bed or floor. Keeping your legs in front of you, bend your knees so they are both pointed toward the ceiling and your feet are flat on the floor. Cross your arms over your chest.  Slightly tip your chin down without bending your neck.  Tense your abdominals and slowly lift your trunk high enough to just clear your shoulder blades. Lifting higher can put excessive stress on the lower back and does not further strengthen your abdominal muscles.  Control your return to the starting position. Repeat __________ times. Complete this exercise __________ times per day.  STRENGTHENING - Quadruped, Opposite UE/LE Lift   Assume a hands and knees position on a firm surface. Keep your hands under your shoulders and your knees under your hips. You may place padding under your knees for comfort.  Find your neutral spine and gently tense your abdominal muscles so that you can maintain this position. Your shoulders and hips should form a rectangle that is parallel with the floor and is not twisted.  Keeping your trunk steady, lift your right hand no higher than your shoulder and then your left leg no higher than your hip. Make sure you are not holding your breath. Hold this position for __________ seconds.  Continuing to keep  your abdominal muscles tense and your back steady, slowly return to your starting position. Repeat with the opposite arm and leg. Repeat __________ times. Complete this exercise __________ times per day.  STRENGTHENING - Abdominals and Quadriceps, Straight Leg Raise   Lie on a firm bed or floor with both legs extended in front of you.  Keeping one leg in contact with the floor, bend the other knee so that your foot can rest flat on the floor.  Find your neutral spine, and tense your abdominal muscles to maintain your spinal position throughout the exercise.  Slowly lift your straight leg off the floor about 6 inches for a count of   15, making sure to not hold your breath.  Still keeping your neutral spine, slowly lower your leg all the way to the floor. Repeat this exercise with each leg __________ times. Complete this exercise __________ times per day. POSTURE AND BODY MECHANICS CONSIDERATIONS - Low Back Sprain Keeping correct posture when sitting, standing or completing your activities will reduce the stress put on different body tissues, allowing injured tissues a chance to heal and limiting painful experiences. The following are general guidelines for improved posture. Your physician or physical therapist will provide you with any instructions specific to your needs. While reading these guidelines, remember:  The exercises prescribed by your provider will help you have the flexibility and strength to maintain correct postures.  The correct posture provides the best environment for your joints to work. All of your joints have less wear and tear when properly supported by a spine with good posture. This means you will experience a healthier, less painful body.  Correct posture must be practiced with all of your activities, especially prolonged sitting and standing. Correct posture is as important when doing repetitive low-stress activities (typing) as it is when doing a single heavy-load  activity (lifting). RESTING POSITIONS Consider which positions are most painful for you when choosing a resting position. If you have pain with flexion-based activities (sitting, bending, stooping, squatting), choose a position that allows you to rest in a less flexed posture. You would want to avoid curling into a fetal position on your side. If your pain worsens with extension-based activities (prolonged standing, working overhead), avoid resting in an extended position such as sleeping on your stomach. Most people will find more comfort when they rest with their spine in a more neutral position, neither too rounded nor too arched. Lying on a non-sagging bed on your side with a pillow between your knees, or on your back with a pillow under your knees will often provide some relief. Keep in mind, being in any one position for a prolonged period of time, no matter how correct your posture, can still lead to stiffness. PROPER SITTING POSTURE In order to minimize stress and discomfort on your spine, you must sit with correct posture. Sitting with good posture should be effortless for a healthy body. Returning to good posture is a gradual process. Many people can work toward this most comfortably by using various supports until they have the flexibility and strength to maintain this posture on their own. When sitting with proper posture, your ears will fall over your shoulders and your shoulders will fall over your hips. You should use the back of the chair to support your upper back. Your lower back will be in a neutral position, just slightly arched. You may place a small pillow or folded towel at the base of your lower back for  support.  When working at a desk, create an environment that supports good, upright posture. Without extra support, muscles tire, which leads to excessive strain on joints and other tissues. Keep these recommendations in mind: CHAIR:  A chair should be able to slide under your desk  when your back makes contact with the back of the chair. This allows you to work closely.  The chair's height should allow your eyes to be level with the upper part of your monitor and your hands to be slightly lower than your elbows. BODY POSITION  Your feet should make contact with the floor. If this is not possible, use a foot rest.  Keep your ears   over your shoulders. This will reduce stress on your neck and low back. INCORRECT SITTING POSTURES  If you are feeling tired and unable to assume a healthy sitting posture, do not slouch or slump. This puts excessive strain on your back tissues, causing more damage and pain. Healthier options include:  Using more support, like a lumbar pillow.  Switching tasks to something that requires you to be upright or walking.  Talking a brief walk.  Lying down to rest in a neutral-spine position. PROLONGED STANDING WHILE SLIGHTLY LEANING FORWARD  When completing a task that requires you to lean forward while standing in one place for a long time, place either foot up on a stationary 2-4 inch high object to help maintain the best posture. When both feet are on the ground, the lower back tends to lose its slight inward curve. If this curve flattens (or becomes too large), then the back and your other joints will experience too much stress, tire more quickly, and can cause pain. CORRECT STANDING POSTURES Proper standing posture should be assumed with all daily activities, even if they only take a few moments, like when brushing your teeth. As in sitting, your ears should fall over your shoulders and your shoulders should fall over your hips. You should keep a slight tension in your abdominal muscles to brace your spine. Your tailbone should point down to the ground, not behind your body, resulting in an over-extended swayback posture.  INCORRECT STANDING POSTURES  Common incorrect standing postures include a forward head, locked knees and/or an excessive  swayback. WALKING Walk with an upright posture. Your ears, shoulders and hips should all line-up. PROLONGED ACTIVITY IN A FLEXED POSITION When completing a task that requires you to bend forward at your waist or lean over a low surface, try to find a way to stabilize 3 out of 4 of your limbs. You can place a hand or elbow on your thigh or rest a knee on the surface you are reaching across. This will provide you more stability, so that your muscles do not tire as quickly. By keeping your knees relaxed, or slightly bent, you will also reduce stress across your lower back. CORRECT LIFTING TECHNIQUES DO :  Assume a wide stance. This will provide you more stability and the opportunity to get as close as possible to the object which you are lifting.  Tense your abdominals to brace your spine. Bend at the knees and hips. Keeping your back locked in a neutral-spine position, lift using your leg muscles. Lift with your legs, keeping your back straight.  Test the weight of unknown objects before attempting to lift them.  Try to keep your elbows locked down at your sides in order get the best strength from your shoulders when carrying an object.  Always ask for help when lifting heavy or awkward objects. INCORRECT LIFTING TECHNIQUES DO NOT:   Lock your knees when lifting, even if it is a small object.  Bend and twist. Pivot at your feet or move your feet when needing to change directions.  Assume that you can safely pick up even a paperclip without proper posture.   This information is not intended to replace advice given to you by your health care provider. Make sure you discuss any questions you have with your health care provider.   Document Released: 04/22/2005 Document Revised: 05/13/2014 Document Reviewed: 08/04/2008 [employer] Interactive Patient Education 2016 [employer]  

## 2015-08-01 DIAGNOSIS — S39012A Strain of muscle, fascia and tendon of lower back, initial encounter: Secondary | ICD-10-CM | POA: Insufficient documentation

## 2015-08-01 DIAGNOSIS — M62830 Muscle spasm of back: Secondary | ICD-10-CM | POA: Insufficient documentation

## 2015-08-01 NOTE — Progress Notes (Signed)
   Subjective:    Patient ID: Erlene QuanScott Geigle, male    DOB: 05/04/1981, 35 y.o.   MRN: [id number]  HPI Pt is a 35 yo male who presents to the clinic with left low back pain with radiation into buttocks. Denies any numbness or tingling down legs. On 07/29/15 he went to UC after picking up his toddler and twisting suddenly. He immediately felt a pull and his left side became painful with all movement. Laying down makes better. All movement at waist makes worse. UC gave him robaxin, solumedrol injection, ibuprofen. He later called back and gave a few tablets of norco. Pt states norco helps with pain. He has never had back problems and has never had this happen. Denies any bowel or bladder dysfunction or saddle anesthesia.    Review of Systems See HPI.    Objective:   Physical Exam  Constitutional: He is oriented to person, place, and time. He appears well-developed and well-nourished.  Cardiovascular: Normal rate, regular rhythm and normal heart sounds.   Musculoskeletal:  No pain to palpation over lumbar spine.  Some tenderness but mostly tightness over left buttocks area.  ROM at waist limited due to left low back pain.  Negative straight leg test bilaterally.   Neurological: He is alert and oriented to person, place, and time.  Psychiatric: He has a normal mood and affect. His behavior is normal.          Assessment & Plan:  Low back strain/muscle spasms- no red flag symptoms. toradol 60mg  IM given today. Prednisone burst. Continue after today. Ibuprofen and robaxin as needed. A few more norco for breakthrough pain given. Discussed importance of keeping moving and stretched out. Consider heat, stretches(given HO with home exercises), ice and biofreeze a few times a day. Consider massage.

## 2015-08-04 ENCOUNTER — Ambulatory Visit (INDEPENDENT_AMBULATORY_CARE_PROVIDER_SITE_OTHER): Payer: Federal, State, Local not specified - PPO | Admitting: Physician Assistant

## 2015-08-04 ENCOUNTER — Encounter: Payer: Self-pay | Admitting: Physician Assistant

## 2015-08-04 ENCOUNTER — Ambulatory Visit (INDEPENDENT_AMBULATORY_CARE_PROVIDER_SITE_OTHER): Payer: Federal, State, Local not specified - PPO

## 2015-08-04 VITALS — BP 129/80 | HR 77 | Wt 255.0 lb

## 2015-08-04 DIAGNOSIS — M545 Low back pain, unspecified: Secondary | ICD-10-CM

## 2015-08-04 DIAGNOSIS — M533 Sacrococcygeal disorders, not elsewhere classified: Secondary | ICD-10-CM | POA: Diagnosis not present

## 2015-08-04 DIAGNOSIS — M5134 Other intervertebral disc degeneration, thoracic region: Secondary | ICD-10-CM

## 2015-08-04 MED ORDER — CARISOPRODOL 250 MG PO TABS: 350.0000 mg | ORAL_TABLET | Freq: Two times a day (BID) | ORAL | Status: DC

## 2015-08-04 MED ORDER — HYDROCODONE-ACETAMINOPHEN 5-325 MG PO TABS: 1.0000 | ORAL_TABLET | Freq: Four times a day (QID) | ORAL | Status: DC | PRN

## 2015-08-04 MED ORDER — MELOXICAM 15 MG PO TABS: 15.0000 mg | ORAL_TABLET | Freq: Every day | ORAL | Status: DC

## 2015-08-04 NOTE — Progress Notes (Signed)
   Subjective:    Patient ID: Brandon Baker, male    DOB: 12/26/1980, 35 y.o.   MRN: [id number]  HPI  Pt comes into clinic again with continous low back pain without radiation down legs. No saddle anesthesia or bowel or bladder dysfunction. Pain is 6/10 per pt and worse with any movement. Ibuprofen and robaxin not helping. On last day of prednisone with little relief. Pain seems to be more midline today and into tailbone. toradol shot did not seem to help a lot. norco helps the most to give him some relief.    Review of Systems    see HPI.  Objective:   Physical Exam  Constitutional: He is oriented to person, place, and time. He appears well-developed and well-nourished.  HENT:  Head: Normocephalic and atraumatic.  Cardiovascular: Normal rate, regular rhythm and normal heart sounds.   Musculoskeletal:  Decreased ROM at waist due to pain. No tenderness along lumbar spine. Tenderness Paraspinous bilateral with tightness.  Negative straight leg test, bilaterally.   Neurological: He is alert and oriented to person, place, and time.  Psychiatric: He has a normal mood and affect. His behavior is normal.          Assessment & Plan:  Midline low back pain- STAT xrays shows some degenerative changes but no acute changes. I still think this is more muscular. Will get PT ordered. Gave stronger muscle relaxer soma given to replace robaxin. Discussed sedation warning. mobic daily to replace ibuprofen. Finish prednisone burst. A few more norco given for acute pain. Continue alternating heat and ice. Follow up in 2 weeks.

## 2015-08-07 DIAGNOSIS — M545 Low back pain, unspecified: Secondary | ICD-10-CM | POA: Insufficient documentation

## 2015-08-10 ENCOUNTER — Ambulatory Visit: Payer: Federal, State, Local not specified - PPO | Admitting: Rehabilitative and Restorative Service Providers"

## 2016-02-09 ENCOUNTER — Ambulatory Visit (INDEPENDENT_AMBULATORY_CARE_PROVIDER_SITE_OTHER): Payer: Federal, State, Local not specified - PPO | Admitting: Physician Assistant

## 2016-02-09 ENCOUNTER — Encounter: Payer: Self-pay | Admitting: Physician Assistant

## 2016-02-09 VITALS — BP 128/60 | HR 87 | Ht 70.0 in | Wt 270.0 lb

## 2016-02-09 DIAGNOSIS — E6609 Other obesity due to excess calories: Secondary | ICD-10-CM

## 2016-02-09 DIAGNOSIS — R0683 Snoring: Secondary | ICD-10-CM | POA: Diagnosis not present

## 2016-02-09 DIAGNOSIS — Z6838 Body mass index (BMI) 38.0-38.9, adult: Secondary | ICD-10-CM | POA: Diagnosis not present

## 2016-02-09 DIAGNOSIS — R5383 Other fatigue: Secondary | ICD-10-CM

## 2016-02-09 DIAGNOSIS — IMO0001 Reserved for inherently not codable concepts without codable children: Secondary | ICD-10-CM

## 2016-02-09 MED ORDER — PHENTERMINE HCL 37.5 MG PO TABS: 37.5000 mg | ORAL_TABLET | Freq: Every day | ORAL | 0 refills | Status: DC

## 2016-02-09 NOTE — Progress Notes (Signed)
   Subjective:    Patient ID: Brandon Baker, male    DOB: 09/18/1980, 35 y.o.   MRN: [id number]  HPI  Pt is 35 yo male who presents to the clinic with wife to discuss snoring and no energy. He snores every night and feels like he wakes up multiple times a night. He wakes up feeling not very rested. Pt is aware he is obese and not exericing or eating right. He admits he felt better when he was 50lbs lighter. He has started the [employer] diet but he has not seemed to lose weight yet. He did lose significant weight when he tried phentermine/HCG a few years back.     Review of Systems See HPI.     Objective:   Physical Exam  Constitutional: He is oriented to person, place, and time. He appears well-developed and well-nourished.  Obese.   HENT:  Head: Normocephalic and atraumatic.  Cardiovascular: Normal rate, regular rhythm and normal heart sounds.   Pulmonary/Chest: Effort normal and breath sounds normal.  Neurological: He is alert and oriented to person, place, and time.  Psychiatric: He has a normal mood and affect. His behavior is normal.          Assessment & Plan:  Snoring/no energy/obese- STOP BANG 5 yes. High risk for sleep apnea. Will order sleep study. Pt aware weight loss can help with this. Encouraged exercise. Restarted phentermine. Discussed side effects. Follow up in 1 month.

## 2016-02-09 NOTE — Patient Instructions (Signed)
Asonor nasal spray.  Start phentermine.

## 2016-03-08 ENCOUNTER — Other Ambulatory Visit: Payer: Self-pay

## 2016-03-08 ENCOUNTER — Encounter: Payer: Self-pay | Admitting: Physician Assistant

## 2016-03-08 ENCOUNTER — Ambulatory Visit (INDEPENDENT_AMBULATORY_CARE_PROVIDER_SITE_OTHER): Payer: Federal, State, Local not specified - PPO | Admitting: Physician Assistant

## 2016-03-08 VITALS — BP 140/81 | HR 79 | Wt 260.0 lb

## 2016-03-08 DIAGNOSIS — R635 Abnormal weight gain: Secondary | ICD-10-CM | POA: Diagnosis not present

## 2016-03-08 MED ORDER — PHENTERMINE HCL 37.5 MG PO TABS: 37.5000 mg | ORAL_TABLET | Freq: Every day | ORAL | 0 refills | Status: DC

## 2016-03-08 NOTE — Progress Notes (Signed)
Pt is here for a weight and bp check.  Denies trouble sleeping, SOB, and palpitations. Pt lost 10 lbs this past month.  Will fax a refill to pharmacy. Pt advised to make next appointment in 30 days. -[facility]MA

## 2016-03-20 ENCOUNTER — Ambulatory Visit (HOSPITAL_BASED_OUTPATIENT_CLINIC_OR_DEPARTMENT_OTHER): Payer: Federal, State, Local not specified - PPO | Attending: Physician Assistant | Admitting: Internal Medicine

## 2016-03-20 DIAGNOSIS — I10 Essential (primary) hypertension: Secondary | ICD-10-CM | POA: Diagnosis not present

## 2016-03-20 DIAGNOSIS — Z6838 Body mass index (BMI) 38.0-38.9, adult: Secondary | ICD-10-CM | POA: Diagnosis not present

## 2016-03-20 DIAGNOSIS — G4733 Obstructive sleep apnea (adult) (pediatric): Secondary | ICD-10-CM | POA: Insufficient documentation

## 2016-03-20 DIAGNOSIS — E6609 Other obesity due to excess calories: Secondary | ICD-10-CM | POA: Diagnosis not present

## 2016-03-20 DIAGNOSIS — R5383 Other fatigue: Secondary | ICD-10-CM | POA: Insufficient documentation

## 2016-03-20 DIAGNOSIS — R0683 Snoring: Secondary | ICD-10-CM | POA: Diagnosis present

## 2016-03-23 NOTE — Procedures (Signed)
Patient Name: Brandon Baker Date: 03/20/2016 Gender: Male D.O.B: 05/07/1982 Age (years): 33 Referring Provider: Bartholomew Crews Height (inches): 34 Interpreting Physician: Baird Lyons MD, ABSM Weight (lbs): 350 RPSGT: Carolin Coy BMI: 71 MRN: [id number] Neck Size: 17.00 CLINICAL INFORMATION Sleep Study Type: Split Night CPAP Indication for sleep study: Excessive Daytime Sleepiness, Hypertension, Obesity Epworth Sleepiness Score: 3  SLEEP STUDY TECHNIQUE As per the AASM Manual for the Scoring of Sleep and Associated Events v2.3 (April 2016) with a hypopnea requiring 4% desaturations. The channels recorded and monitored were frontal, central and occipital EEG, electrooculogram (EOG), submentalis EMG (chin), nasal and oral airflow, thoracic and abdominal wall motion, anterior tibialis EMG, snore microphone, electrocardiogram, and pulse oximetry. Continuous positive airway pressure (CPAP) was initiated when the patient met split night criteria and was titrated according to treat sleep-disordered breathing.  MEDICATIONS Medications self-administered by patient taken the night of the study : NONE REPORTED  RESPIRATORY PARAMETERS Diagnostic Total AHI (/hr): 26.1 RDI (/hr): 34.6 OA Index (/hr): - CA Index (/hr): 0.0 REM AHI (/hr): 64.6 NREM AHI (/hr): 21.9 Supine AHI (/hr): 13.2 Non-supine AHI (/hr): 30.93 Min O2 Sat (%): 82.00 Mean O2 (%): 95.72 Time below 88% (min): 0.9   Titration Optimal Pressure (cm): 13 AHI at Optimal Pressure (/hr): 0.0 Min O2 at Optimal Pressure (%): 95.0 Supine % at Optimal (%): 100 Sleep % at Optimal (%): 39    SLEEP ARCHITECTURE The recording time for the entire night was 361.4 minutes. During a baseline period of 207.9 minutes, the patient slept for 133.5 minutes in REM and nonREM, yielding a sleep efficiency of 64.2%. Sleep onset after lights out was 24.4 minutes with a REM latency of 83.5 minutes. The patient spent 26.22% of the night  in stage N1 sleep, 64.04% in stage N2 sleep, 0.00% in stage N3 and 9.74% in REM. During the titration period of 150.8 minutes, the patient slept for 83.0 minutes in REM and nonREM, yielding a sleep efficiency of 55.1%. Sleep onset after CPAP initiation was 28.8 minutes with a REM latency of 70.0 minutes. The patient spent 16.87% of the night in stage N1 sleep, 49.40% in stage N2 sleep, 0.00% in stage N3 and 33.73% in REM.  CARDIAC DATA The 2 lead EKG demonstrated sinus rhythm. The mean heart rate was 86.17 beats per minute. Other EKG findings include: PVCs.  LEG MOVEMENT DATA The total Periodic Limb Movements of Sleep (PLMS) were 4. The PLMS index was 1.11 .  IMPRESSIONS - Moderate obstructive sleep apnea occurred during the diagnostic portion of the study(AHI = 26.1/hour). An optimal PAP pressure was selected for this patient ( 13 cm of water) - No significant central sleep apnea occurred during the diagnostic portion of the study (CAI = 0.0/hour). - Severe oxygen desaturation was noted during the diagnostic portion of the study (Min O2 = 82.00%). - The patient snored with Moderate snoring volume during the diagnostic portion of the study. - EKG findings include PVCs. - Clinically significant periodic limb movements did not occur during sleep.  DIAGNOSIS - Obstructive Sleep Apnea (327.23 [G47.33 ICD-10])  RECOMMENDATIONS - Trial of CPAP therapy on 13 cm H2O with a Small size Resmed Full Face Mask AirFit F20 mask and heated humidification. - Avoid alcohol, sedatives and other CNS depressants that may worsen sleep apnea and disrupt normal sleep architecture. - Sleep hygiene should be reviewed to assess factors that may improve sleep quality. - Weight management and regular exercise should be initiated or continued.  [Electronically signed]  03/23/2016 12:21 PM  Baird Lyons MD, [place], American Board of Sleep Medicine   NPI: [id number]  [place], American  Board of Sleep Medicine  ELECTRONICALLY SIGNED ON:  03/23/2016, 12:26 PM [place] PH: [phone]   FX: [phone] [place]

## 2016-03-25 ENCOUNTER — Encounter: Payer: Self-pay | Admitting: Physician Assistant

## 2016-03-25 DIAGNOSIS — G4733 Obstructive sleep apnea (adult) (pediatric): Secondary | ICD-10-CM | POA: Insufficient documentation

## 2016-03-27 ENCOUNTER — Other Ambulatory Visit: Payer: Self-pay

## 2016-03-27 MED ORDER — AMBULATORY NON FORMULARY MEDICATION: 0 refills | Status: DC

## 2016-04-05 ENCOUNTER — Encounter: Payer: Self-pay | Admitting: Physician Assistant

## 2016-04-05 ENCOUNTER — Ambulatory Visit (INDEPENDENT_AMBULATORY_CARE_PROVIDER_SITE_OTHER): Payer: Federal, State, Local not specified - PPO | Admitting: Physician Assistant

## 2016-04-05 VITALS — BP 136/80 | HR 85

## 2016-04-05 DIAGNOSIS — R635 Abnormal weight gain: Secondary | ICD-10-CM | POA: Diagnosis not present

## 2016-04-05 DIAGNOSIS — G4733 Obstructive sleep apnea (adult) (pediatric): Secondary | ICD-10-CM | POA: Diagnosis not present

## 2016-04-05 MED ORDER — PHENTERMINE HCL 37.5 MG PO TABS: 37.5000 mg | ORAL_TABLET | Freq: Every day | ORAL | 0 refills | Status: DC

## 2016-04-05 NOTE — Progress Notes (Signed)
Pt is here for a weight check. Denies trouble sleeping and palpitations. Pt lost 1 lb. Will fax Rx to [facility]. Pt advised to make next appointment in 1 month.   Please advise patient that goal weight loss is 4lbs a month. In one month will reassess. Jade Breeback PA-C.

## 2016-05-03 ENCOUNTER — Ambulatory Visit: Payer: Federal, State, Local not specified - PPO

## 2016-05-06 DIAGNOSIS — G4733 Obstructive sleep apnea (adult) (pediatric): Secondary | ICD-10-CM | POA: Diagnosis not present

## 2016-05-31 ENCOUNTER — Ambulatory Visit (INDEPENDENT_AMBULATORY_CARE_PROVIDER_SITE_OTHER): Payer: Federal, State, Local not specified - PPO | Admitting: Family Medicine

## 2016-05-31 VITALS — BP 134/80 | HR 71 | Wt 267.0 lb

## 2016-05-31 DIAGNOSIS — R635 Abnormal weight gain: Secondary | ICD-10-CM

## 2016-05-31 DIAGNOSIS — Z6838 Body mass index (BMI) 38.0-38.9, adult: Secondary | ICD-10-CM | POA: Diagnosis not present

## 2016-05-31 MED ORDER — PHENTERMINE HCL 37.5 MG PO TABS: 37.5000 mg | ORAL_TABLET | Freq: Every day | ORAL | 0 refills | Status: DC

## 2016-05-31 NOTE — Progress Notes (Signed)
Please let patient know I'm very worried about potential for rebound after he comes off the phentermine. We will go ahead and fill it this time but he needs to lose at least 4 pounds over the next 30 days and he needs to go to a consultation for nutrition to help him set up a plan. Otherwise when he does come off the medication he will get all back very quickly. He needs to try to attend the first nutrition session in the next 30 days pending refill of the next medication refill. Nani Gasseratherine Metheney, MD

## 2016-05-31 NOTE — Progress Notes (Signed)
Patient is here for blood pressure and weight check. Denies any trouble sleeping, palpitations, or any other medication problems. Patient has not lost weight. Patient states he ran out of medication a week ago, and has not been watching his diet. A refill for Phentermine will be sent to Provider for review. Patient advised set a goal of minimum 4 lbs weight loss and to schedule a four week nurse visit. Advised if refill is denied we will contact him. Verbalized understanding, no further questions.

## 2016-05-31 NOTE — Progress Notes (Signed)
Patient advised of Physician recommendation and referral. Verbalized understanding, no further questions.

## 2016-06-06 DIAGNOSIS — G4733 Obstructive sleep apnea (adult) (pediatric): Secondary | ICD-10-CM | POA: Diagnosis not present

## 2016-06-07 DIAGNOSIS — R635 Abnormal weight gain: Secondary | ICD-10-CM | POA: Diagnosis not present

## 2016-06-10 ENCOUNTER — Encounter: Payer: Self-pay | Admitting: Osteopathic Medicine

## 2016-06-10 ENCOUNTER — Ambulatory Visit (INDEPENDENT_AMBULATORY_CARE_PROVIDER_SITE_OTHER): Payer: Federal, State, Local not specified - PPO | Admitting: Osteopathic Medicine

## 2016-06-10 VITALS — BP 132/79 | HR 72 | Ht 70.0 in | Wt 260.0 lb

## 2016-06-10 DIAGNOSIS — K2900 Acute gastritis without bleeding: Secondary | ICD-10-CM

## 2016-06-10 MED ORDER — DICYCLOMINE HCL 20 MG PO TABS: 20.0000 mg | ORAL_TABLET | Freq: Three times a day (TID) | ORAL | 0 refills | Status: DC

## 2016-06-10 MED ORDER — DIPHENOXYLATE-ATROPINE 2.5-0.025 MG PO TABS: 1.0000 | ORAL_TABLET | Freq: Four times a day (QID) | ORAL | 0 refills | Status: DC | PRN

## 2016-06-10 MED ORDER — ONDANSETRON 8 MG PO TBDP: 8.0000 mg | ORAL_TABLET | Freq: Three times a day (TID) | ORAL | 0 refills | Status: DC | PRN

## 2016-06-10 NOTE — Progress Notes (Signed)
HPI: Brandon Baker is a 36 y.o. male  who presents to [facility] [place] today, 06/10/16,  for chief complaint of:  Chief Complaint  Patient presents with  . Abdominal Pain    Loose stools/abdominal pain . Context: No recent travel or known food exposure . Location/Quality: Cramping abdominal pain, migrating throughout the abdomen. Loose stool, nonbloody, multiple bowel movements per day. . Duration: 5-6 days . Modifying factors: Imodium only . Assoc signs/symptoms: No fever, no chills, no localized abdominal pain    Past medical history, surgical history, social history and family history reviewed.  Patient Active Problem List   Diagnosis Date Noted  . OSA (obstructive sleep apnea) 03/25/2016  . Midline low back pain without sciatica 08/07/2015  . Low back strain 08/01/2015  . Muscle spasm of back 08/01/2015  . Chigger bite 11/24/2014  . Tobacco abuse 08/07/2012    Current medication list and allergy/intolerance information reviewed.   Current Outpatient Prescriptions on File Prior to Visit  Medication Sig Dispense Refill  . AMBULATORY NON FORMULARY MEDICATION Trial of CPAP therapy on 13 cm H2O with a Small size Resmed Full Face Mask AirFit F20 mask and heated humidification. 1 each 0  . phentermine (ADIPEX-P) 37.5 MG tablet Take 1 tablet (37.5 mg total) by mouth daily before breakfast. 30 tablet 0   No current facility-administered medications on file prior to visit.    No Known Allergies    Review of Systems:  Constitutional: No recent illness  HEENT: No  headache, no vision change  Cardiac: No  chest pain, No  pressure, No palpitations  Respiratory:  No  shortness of breath. No  Cough  Gastrointestinal: +abdominal pain, +change on bowel habits  Musculoskeletal: No new myalgia/arthralgia  Skin: No  Rash  Hem/Onc: No  easy bruising/bleeding, No  abnormal lumps/bumps  Neurologic: No  weakness, No  Dizziness  Psychiatric: No   concerns with depression, No  concerns with anxiety  Exam:  BP 132/79   Pulse 72   Ht 5\' 10"  (1.778 m)   Wt 260 lb (117.9 kg)   BMI 37.31 kg/m   Constitutional: VS see above. General Appearance: alert, well-developed, well-nourished, NAD  Eyes: Normal lids and conjunctive, non-icteric sclera  Ears, Nose, Mouth, Throat: MMM, Normal external inspection ears/nares/mouth/lips/gums.  Neck: No masses, trachea midline.   Respiratory: Normal respiratory effort. no wheeze, no rhonchi, no rales  Cardiovascular: S1/S2 normal, no murmur, no rub/gallop auscultated. RRR.   Musculoskeletal: Gait normal. Symmetric and independent movement of all extremities  Abdominal: No guarding/rebound, normal to percussion, normal bowel sounds 4. No distention. No organomegaly. Mild diffuse tenderness but nonacute abdomen  Neurological: Normal balance/coordination. No tremor.  Skin: warm, dry, intact.   Psychiatric: Normal judgment/insight. Normal mood and affect. Oriented x3.      ASSESSMENT/PLAN:   Advised most likely viral gastroenteritis however patient is on day 5-6 of symptoms and would hopefully be getting better by now. We'll give it a few more days with medications for symptom relief as noted below, dietary modifications for bland diet, patient advised if worsening pain, blood in stool, fever or other concerns would need to know about this right away and at that point would probably consider CT abdomen.  Other acute gastritis without hemorrhage - Plan: ondansetron (ZOFRAN-ODT) 8 MG disintegrating tablet, diphenoxylate-atropine (LOMOTIL) 2.5-0.025 MG tablet, dicyclomine (BENTYL) 20 MG tablet    Patient Instructions  Viral Gastroenteritis, Adult Viral gastroenteritis is also known as the stomach flu. This condition is caused  by various viruses. These viruses can be passed from person to person very easily (are very contagious). This condition may affect your stomach, small intestine, and large  intestine. It can cause sudden watery diarrhea, fever, and vomiting. Diarrhea and vomiting can make you feel weak and cause you to become dehydrated. You may not be able to keep fluids down. Dehydration can make you tired and thirsty, cause you to have a dry mouth, and decrease how often you urinate. Older adults and people with other diseases or a weak immune system are at higher risk for dehydration. It is important to replace the fluids that you lose from diarrhea and vomiting. If you become severely dehydrated, you may need to get fluids through an IV tube. What are the causes? Gastroenteritis is caused by various viruses, including rotavirus and norovirus. Norovirus is the most common cause in adults. You can get sick by eating food, drinking water, or touching a surface contaminated with one of these viruses. You can also get sick from sharing utensils or other personal items with an infected person. What increases the risk? This condition is more likely to develop in people:  Who have a weak defense system (immune system).  Who live with one or more children who are younger than 9 years old.  Who live in a nursing home.  Who go on cruise ships. What are the signs or symptoms? Symptoms of this condition start suddenly 1-2 days after exposure to a virus. Symptoms may last a few days or as long as a week. The most common symptoms are watery diarrhea and vomiting. Other symptoms include:  Fever.  Headache.  Fatigue.  Pain in the abdomen.  Chills.  Weakness.  Nausea.  Muscle aches.  Loss of appetite. How is this diagnosed? This condition is diagnosed with a medical history and physical exam. You may also have a stool test to check for viruses or other infections. How is this treated? This condition typically goes away on its own. The focus of treatment is to restore lost fluids (rehydration). Your health care provider may recommend that you take an oral rehydration solution  (ORS) to replace important salts and minerals (electrolytes) in your body. Severe cases of this condition may require giving fluids through an IV tube. Treatment may also include medicine to help with your symptoms. Follow these instructions at home: Follow instructions from your health care provider about how to care for yourself at home. Eating and drinking Follow these recommendations as told by your health care provider:  Take an ORS. This is a drink that is sold at pharmacies and retail stores.  Drink clear fluids in small amounts as you are able. Clear fluids include water, ice chips, diluted fruit juice, and low-calorie sports drinks.  Eat bland, easy-to-digest foods in small amounts as you are able. These foods include bananas, applesauce, rice, lean meats, toast, and crackers.  Avoid fluids that contain a lot of sugar or caffeine, such as energy drinks, sports drinks, and soda.  Avoid alcohol.  Avoid spicy or fatty foods. General instructions  Drink enough fluid to keep your urine clear or pale yellow.  Wash your hands often. If soap and water are not available, use hand sanitizer.  Make sure that all people in your household wash their hands well and often.  Take over-the-counter and prescription medicines only as told by your health care provider.  Rest at home while you recover.  Watch your condition for any changes.  Take a  warm bath to relieve any burning or pain from frequent diarrhea episodes.  Keep all follow-up visits as told by your health care provider. This is important. Contact a health care provider if:  You cannot keep fluids down.  Your symptoms get worse.  You have new symptoms.  You feel light-headed or dizzy.  You have muscle cramps. Get help right away if:  You have chest pain.  You feel extremely weak or you faint.  You see blood in your vomit.  Your vomit looks like coffee grounds.  You have bloody or black stools or stools that  look like tar.  You have a severe headache, a stiff neck, or both.  You have a rash.  You have severe pain, cramping, or bloating in your abdomen.  You have trouble breathing or you are breathing very quickly.  Your heart is beating very quickly.  Your skin feels cold and clammy.  You feel confused.  You have pain when you urinate.  You have signs of dehydration, such as:  Dark urine, very little urine, or no urine.  Cracked lips.  Dry mouth.  Sunken eyes.  Sleepiness.  Weakness. This information is not intended to replace advice given to you by your health care provider. Make sure you discuss any questions you have with your health care provider. Document Released: 04/22/2005 Document Revised: 10/04/2015 Document Reviewed: 12/27/2014 Elsevier Interactive Patient Education  2017 [employer]     Follow-up plan: Return if symptoms worsen or fail to improve.  Visit summary with medication list and pertinent instructions was printed for patient to review, alert us if any changes needed. All questions at time of visit were answered - patient instructed to contact office with any additional concerns. ER/RTC precautions were reviewed with the patient and understanding verbalized.

## 2016-06-10 NOTE — Patient Instructions (Signed)

## 2016-06-28 ENCOUNTER — Ambulatory Visit: Payer: Federal, State, Local not specified - PPO

## 2016-07-04 DIAGNOSIS — G4733 Obstructive sleep apnea (adult) (pediatric): Secondary | ICD-10-CM | POA: Diagnosis not present

## 2016-08-04 DIAGNOSIS — G4733 Obstructive sleep apnea (adult) (pediatric): Secondary | ICD-10-CM | POA: Diagnosis not present

## 2016-08-13 DIAGNOSIS — R635 Abnormal weight gain: Secondary | ICD-10-CM | POA: Diagnosis not present

## 2016-09-03 DIAGNOSIS — G4733 Obstructive sleep apnea (adult) (pediatric): Secondary | ICD-10-CM | POA: Diagnosis not present

## 2016-10-04 DIAGNOSIS — G4733 Obstructive sleep apnea (adult) (pediatric): Secondary | ICD-10-CM | POA: Diagnosis not present

## 2016-11-03 DIAGNOSIS — G4733 Obstructive sleep apnea (adult) (pediatric): Secondary | ICD-10-CM | POA: Diagnosis not present

## 2016-12-04 DIAGNOSIS — G4733 Obstructive sleep apnea (adult) (pediatric): Secondary | ICD-10-CM | POA: Diagnosis not present

## 2017-01-04 DIAGNOSIS — G4733 Obstructive sleep apnea (adult) (pediatric): Secondary | ICD-10-CM | POA: Diagnosis not present

## 2017-02-03 DIAGNOSIS — G4733 Obstructive sleep apnea (adult) (pediatric): Secondary | ICD-10-CM | POA: Diagnosis not present

## 2017-03-06 DIAGNOSIS — G4733 Obstructive sleep apnea (adult) (pediatric): Secondary | ICD-10-CM | POA: Diagnosis not present

## 2017-04-05 DIAGNOSIS — G4733 Obstructive sleep apnea (adult) (pediatric): Secondary | ICD-10-CM | POA: Diagnosis not present

## 2017-06-03 ENCOUNTER — Encounter: Payer: Self-pay | Admitting: Physician Assistant

## 2017-06-03 ENCOUNTER — Ambulatory Visit: Payer: Federal, State, Local not specified - PPO | Admitting: Physician Assistant

## 2017-06-03 VITALS — BP 138/80 | HR 79 | Ht 70.0 in | Wt 292.0 lb

## 2017-06-03 DIAGNOSIS — E559 Vitamin D deficiency, unspecified: Secondary | ICD-10-CM

## 2017-06-03 DIAGNOSIS — F5102 Adjustment insomnia: Secondary | ICD-10-CM

## 2017-06-03 DIAGNOSIS — R7989 Other specified abnormal findings of blood chemistry: Secondary | ICD-10-CM | POA: Diagnosis not present

## 2017-06-03 DIAGNOSIS — F4321 Adjustment disorder with depressed mood: Secondary | ICD-10-CM

## 2017-06-03 DIAGNOSIS — R5383 Other fatigue: Secondary | ICD-10-CM | POA: Diagnosis not present

## 2017-06-03 DIAGNOSIS — R7301 Impaired fasting glucose: Secondary | ICD-10-CM | POA: Diagnosis not present

## 2017-06-03 MED ORDER — TRAZODONE HCL 50 MG PO TABS: 25.0000 mg | ORAL_TABLET | Freq: Every evening | ORAL | 1 refills | Status: DC | PRN

## 2017-06-03 MED ORDER — FLUOXETINE HCL 20 MG PO TABS: ORAL_TABLET | ORAL | 1 refills | Status: DC

## 2017-06-03 NOTE — Progress Notes (Signed)
Subjective:    Patient ID: Brandon Baker, male    DOB: 11/15/1980, 37 y.o.   MRN: [id number]  HPI Pt is a 37 yo morbidly obese male who presents to the clinic with insomnia, anxiety, depression. His mother recently died and he has had to take care of all of her stuff. His wife is also in the middle of a job change where she was treated badly but not money is tight. He doesn't have a lot of motivation to do anything. At work he is less productive. He is having trouble concentrating. Denies any SI/HC. Never been on medication before. He has gained quite a bit of weight in last 2 years. This has him down as well. Pt has not always had problems with sleep but over the last month he cannot get to or stay asleep. He uses CPAP at night but finds it is not benefical.   .. Active Ambulatory Problems    Diagnosis Date Noted  . Tobacco abuse 08/07/2012  . Chigger bite 11/24/2014  . Low back strain 08/01/2015  . Muscle spasm of back 08/01/2015  . Midline low back pain without sciatica 08/07/2015  . OSA (obstructive sleep apnea) 03/25/2016  . Situational depression 06/04/2017  . Adjustment insomnia 06/04/2017  . Morbid obesity (HCC) 06/04/2017   Resolved Ambulatory Problems    Diagnosis Date Noted  . No Resolved Ambulatory Problems   No Additional Past Medical History      Review of Systems  All other systems reviewed and are negative.      Objective:   Physical Exam  Constitutional: He is oriented to person, place, and time. He appears well-developed and well-nourished.  HENT:  Head: Normocephalic and atraumatic.  Neck: Normal range of motion. Neck supple.  Cardiovascular: Normal rate, regular rhythm and normal heart sounds.  Pulmonary/Chest: Effort normal and breath sounds normal.  Lymphadenopathy:    He has no cervical adenopathy.  Neurological: He is alert and oriented to person, place, and time.  Psychiatric: He has a normal mood and affect. His behavior is normal.         Assessment & Plan:  Marland Kitchen.Marland Kitchen.Lorin PicketScott was seen today for anxiety.  Diagnoses and all orders for this visit:  Situational depression -     FLUoxetine (PROZAC) 20 MG tablet; Take 1/2 tablet for 7 days and then one tablet daily. -     CBC -     Comprehensive metabolic panel -     C-reactive protein -     Ferritin -     Sedimentation rate -     TSH -     Vitamin B12 -     Vit D  25 hydroxy (rtn osteoporosis monitoring)  No energy -     Testosterone -     CBC -     Comprehensive metabolic panel -     C-reactive protein -     Ferritin -     Sedimentation rate -     TSH -     Vitamin B12 -     Vit D  25 hydroxy (rtn osteoporosis monitoring)  Adjustment insomnia -     traZODone (DESYREL) 50 MG tablet; Take 0.5-1 tablets (25-50 mg total) by mouth at bedtime as needed for sleep.  Morbid obesity (HCC)     .Marland Kitchen. [facility] 06/03/2017  Decreased Interest 3  Down, Depressed, Hopeless 2  PHQ - 2 Score 5  Altered sleeping 3  Tired, decreased energy 3  Change in appetite 3  Feeling bad or failure about yourself  0  Trouble concentrating 3  Moving slowly or fidgety/restless 2  Suicidal thoughts 0  PHQ-9 Score 19  Difficult doing work/chores Very difficult   .Marland Kitchen GAD 7 : Generalized Anxiety Score 06/03/2017  Nervous, Anxious, on Edge 2  Control/stop worrying 1  Worry too much - different things 1  Trouble relaxing 2  Restless 3  Easily annoyed or irritable 3  Afraid - awful might happen 0  Total GAD 7 Score 12    I would like to look for other causes of depression/anxiety/fatigue. Ordered labs.   Added trazodone to help with sleep now. Discussed side effects.   Start prozac for mood. Discussed side effects. Follow up in 4-6 weeks.   Marland Kitchen.Spent 30 minutes with patient and greater than 50 percent of visit spent counseling patient regarding treatment plan.

## 2017-06-04 DIAGNOSIS — E66813 Obesity, class 3: Secondary | ICD-10-CM | POA: Insufficient documentation

## 2017-06-04 DIAGNOSIS — Z6839 Body mass index (BMI) 39.0-39.9, adult: Secondary | ICD-10-CM

## 2017-06-04 DIAGNOSIS — Z6841 Body Mass Index (BMI) 40.0 and over, adult: Secondary | ICD-10-CM | POA: Insufficient documentation

## 2017-06-04 DIAGNOSIS — F4321 Adjustment disorder with depressed mood: Secondary | ICD-10-CM | POA: Insufficient documentation

## 2017-06-04 DIAGNOSIS — F5102 Adjustment insomnia: Secondary | ICD-10-CM | POA: Insufficient documentation

## 2017-06-04 DIAGNOSIS — E6609 Other obesity due to excess calories: Secondary | ICD-10-CM | POA: Insufficient documentation

## 2017-06-05 ENCOUNTER — Encounter: Payer: Self-pay | Admitting: Osteopathic Medicine

## 2017-06-05 ENCOUNTER — Ambulatory Visit (INDEPENDENT_AMBULATORY_CARE_PROVIDER_SITE_OTHER): Payer: Federal, State, Local not specified - PPO | Admitting: Osteopathic Medicine

## 2017-06-05 VITALS — BP 138/105 | HR 91 | Temp 98.0°F | Wt 286.1 lb

## 2017-06-05 DIAGNOSIS — B9789 Other viral agents as the cause of diseases classified elsewhere: Secondary | ICD-10-CM | POA: Diagnosis not present

## 2017-06-05 DIAGNOSIS — J069 Acute upper respiratory infection, unspecified: Secondary | ICD-10-CM

## 2017-06-05 DIAGNOSIS — F4321 Adjustment disorder with depressed mood: Secondary | ICD-10-CM | POA: Diagnosis not present

## 2017-06-05 DIAGNOSIS — R5383 Other fatigue: Secondary | ICD-10-CM | POA: Diagnosis not present

## 2017-06-05 MED ORDER — LIDOCAINE VISCOUS HCL 2 % MT SOLN: 5.0000 mL | OROMUCOSAL | 1 refills | Status: DC | PRN

## 2017-06-05 MED ORDER — GUAIFENESIN-CODEINE 100-10 MG/5ML PO SOLN: 5.0000 mL | Freq: Four times a day (QID) | ORAL | 0 refills | Status: DC | PRN

## 2017-06-05 MED ORDER — IPRATROPIUM BROMIDE 0.06 % NA SOLN: 2.0000 | Freq: Four times a day (QID) | NASAL | 1 refills | Status: DC

## 2017-06-05 NOTE — Patient Instructions (Addendum)
Over-the-Counter Medications & Home Remedies for Upper Respiratory Illness  Note: the following list assumes no pregnancy, normal liver & kidney function and no other drug interactions. Dr. Lyn HollingsheadAlexander has highlighted medications which are safe for you to use, but these may not be appropriate for everyone. Always ask a pharmacist or qualified medical provider if you have any questions!   Aches/Pains, Fever, Headache Acetaminophen (Tylenol) 500 mg tablets - take max 2 tablets (1000 mg) every 6 hours (4 times per day)  Ibuprofen (Motrin) 200 mg tablets - take max 4 tablets (800 mg) every 6 hours*  Sinus Congestion Prescription Atrovent as directed Nasal Saline if desired Phenylephrine (Sudafed) 10 mg tablets every 4 hours (or the 12-hour formulation)* Diphenhydramine (Benadryl) 25 mg tablets - take max 2 tablets every 4 hours  Cough & Sore Throat Prescription cough pills or syrups as directed Dextromethorphan (Robitussin, others) - cough suppressant Guaifenesin (Robitussin, Mucinex, others) - note this is in the prescription syrup, do not double up! - expectorant (helps cough up mucus) (Dextromethorphan and Guaifenesin also come in a combination tablet/syrup OTC) Prescription Lidocaine - numbing agent to back of throat  Lozenges w/ Benzocaine + Menthol (Cepacol) (benzocaine is also in Chloraseptic spray) Honey - as much as you want! Teas which "coat the throat" - look for ingredients Elm Bark, Licorice Root, Marshmallow Root  Other  Zinc Lozenges within 24 hours of symptoms onset - mixed evidence this shortens the duration of the common cold Don't waste your money on Vitamin C or Echinacea  *Caution in patients with high blood pressure

## 2017-06-05 NOTE — Progress Notes (Signed)
HPI: Brandon Baker is a 37 y.o. male who  has no past medical history on file.  he presents to [facility] today, 06/05/17,  for chief complaint of: FEELING SICK   Nonproductive cough, nasal congestion, nasal mucus x3 days.  No OTC meds tried No fever/chills Felt he couldn't get a deep breath last night but no SOB now     Past medical, surgical, social and family history reviewed:  Patient Active Problem List   Diagnosis Date Noted  . Situational depression 06/04/2017  . Adjustment insomnia 06/04/2017  . Morbid obesity (HCC) 06/04/2017  . OSA (obstructive sleep apnea) 03/25/2016  . Midline low back pain without sciatica 08/07/2015  . Low back strain 08/01/2015  . Muscle spasm of back 08/01/2015  . Chigger bite 11/24/2014  . Tobacco abuse 08/07/2012    No past surgical history on file.  Social History   Tobacco Use  . Smoking status: Former Smoker    Packs/day: 1.00    Types: Cigarettes, E-cigarettes    Last attempt to quit: 07/05/2014    Years since quitting: 2.9  . Smokeless tobacco: Never Used  Substance Use Topics  . Alcohol use: Not on file    Family History  Problem Relation Age of Onset  . Alcohol abuse Mother   . Alcohol abuse Father   . Heart attack Maternal Uncle   . Hypertension Maternal Uncle   . Diabetes Maternal Grandmother   . Hyperlipidemia Maternal Grandmother   . Hypertension Maternal Grandmother   . Heart attack Maternal Grandfather   . Diabetes Maternal Grandfather   . Hyperlipidemia Maternal Grandfather   . Hypertension Maternal Grandfather      Current medication list and allergy/intolerance information reviewed:    Current Outpatient Medications  Medication Sig Dispense Refill  . AMBULATORY NON FORMULARY MEDICATION Trial of CPAP therapy on 13 cm H2O with a Small size Resmed Full Face Mask AirFit F20 mask and heated humidification. (Patient not taking: Reported on 06/03/2017) 1 each 0  . dicyclomine  (BENTYL) 20 MG tablet Take 1 tablet (20 mg total) by mouth 4 (four) times daily -  before meals and at bedtime. (Patient not taking: Reported on 06/03/2017) 30 tablet 0  . diphenoxylate-atropine (LOMOTIL) 2.5-0.025 MG tablet Take 1-2 tablets by mouth 4 (four) times daily as needed for diarrhea or loose stools. (Patient not taking: Reported on 06/03/2017) 30 tablet 0  . FLUoxetine (PROZAC) 20 MG tablet Take 1/2 tablet for 7 days and then one tablet daily. 30 tablet 1  . ondansetron (ZOFRAN-ODT) 8 MG disintegrating tablet Take 1 tablet (8 mg total) by mouth every 8 (eight) hours as needed for nausea or vomiting. Try taking pre-meals (Patient not taking: Reported on 06/03/2017) 20 tablet 0  . phentermine (ADIPEX-P) 37.5 MG tablet Take 1 tablet (37.5 mg total) by mouth daily before breakfast. (Patient not taking: Reported on 06/03/2017) 30 tablet 0  . traZODone (DESYREL) 50 MG tablet Take 0.5-1 tablets (25-50 mg total) by mouth at bedtime as needed for sleep. 30 tablet 1   No current facility-administered medications for this visit.     No Known Allergies    Review of Systems:  Constitutional:  No  fever, no chills, +recent illness, No unintentional weight changes. +significant fatigue.   HEENT: No  headache, no vision change, no hearing change, +sore throat, +sinus pressure  Cardiac: No  chest pain, No  pressure, No palpitations  Respiratory:  No  shortness of breath. No  Cough  Gastrointestinal: No  abdominal pain, No  nausea, No  vomiting,  No  blood in stool, No  diarrhea  Musculoskeletal: No new myalgia/arthralgia  Skin: No  Rash  Neurologic: No  weakness, No  dizziness  Exam:  BP (!) 138/105   Pulse 91   Temp 98 F (36.7 C) (Oral)   Wt 286 lb 1.3 oz (129.8 kg)   BMI 41.05 kg/m   Constitutional: VS see above. General Appearance: alert, well-developed, well-nourished, NAD  Eyes: Normal lids and conjunctive, non-icteric sclera  Ears, Nose, Mouth, Throat: MMM, Normal external  inspection ears/nares/mouth/lips/gums. TM normal bilaterally. Pharynx/tonsils +erythema, no exudate. Nasal mucosa normal.   Neck: No masses, trachea midline. No tenderness/mass appreciated. No lymphadenopathy  Respiratory: Normal respiratory effort. no wheeze, no rhonchi, no rales  Cardiovascular: S1/S2 normal, no murmur, no rub/gallop auscultated. RRR.  Musculoskeletal: Gait normal.   Neurological: Normal balance/coordination. No tremor.   Skin: warm, dry, intact.   Psychiatric: Normal judgment/insight. Normal mood and affect.     ASSESSMENT/PLAN:   Viral URI with cough   Meds ordered this encounter  Medications  . Lidocaine HCl 2 % SOLN    Sig: Use as directed 5-10 mLs in the mouth or throat every 3 (three) hours as needed (mouth/throat pain).    Dispense:  100 mL    Refill:  1  . guaiFENesin-codeine 100-10 MG/5ML syrup    Sig: Take 5-10 mLs by mouth every 6 (six) hours as needed for cough.    Dispense:  118 mL    Refill:  0  . ipratropium (ATROVENT) 0.06 % nasal spray    Sig: Place 2 sprays into both nostrils 4 (four) times daily.    Dispense:  15 mL    Refill:  1       Patient Instructions  Over-the-Counter Medications & Home Remedies for Upper Respiratory Illness  Note: the following list assumes no pregnancy, normal liver & kidney function and no other drug interactions. Dr. Lyn Hollingshead has highlighted medications which are safe for you to use, but these may not be appropriate for everyone. Always ask a pharmacist or qualified medical provider if you have any questions!   Aches/Pains, Fever, Headache Acetaminophen (Tylenol) 500 mg tablets - take max 2 tablets (1000 mg) every 6 hours (4 times per day)  Ibuprofen (Motrin) 200 mg tablets - take max 4 tablets (800 mg) every 6 hours*  Sinus Congestion Prescription Atrovent as directed Nasal Saline if desired Phenylephrine (Sudafed) 10 mg tablets every 4 hours (or the 12-hour formulation)* Diphenhydramine  (Benadryl) 25 mg tablets - take max 2 tablets every 4 hours  Cough & Sore Throat Prescription cough pills or syrups as directed Dextromethorphan (Robitussin, others) - cough suppressant Guaifenesin (Robitussin, Mucinex, others) - note this is in the prescription syrup, do not double up! - expectorant (helps cough up mucus) (Dextromethorphan and Guaifenesin also come in a combination tablet/syrup OTC) Prescription Lidocaine - numbing agent to back of throat  Lozenges w/ Benzocaine + Menthol (Cepacol) (benzocaine is also in Chloraseptic spray) Honey - as much as you want! Teas which "coat the throat" - look for ingredients Elm Bark, Licorice Root, Marshmallow Root  Other  Zinc Lozenges within 24 hours of symptoms onset - mixed evidence this shortens the duration of the common cold Don't waste your money on Vitamin C or Echinacea  *Caution in patients with high blood pressure       Visit summary with medication list and pertinent instructions was printed for patient to  review. All questions at time of visit were answered - patient instructed to contact office with any additional concerns. ER/RTC precautions were reviewed with the patient.   Follow-up plan: Return if symptoms worsen or fail to improve and as directed by PCP for routine care .  Note: Total time spent 25 minutes, greater than 50% of the visit was spent face-to-face counseling and coordinating care for the following: The encounter diagnosis was Viral URI with cough..  Please note: voice recognition software was used to produce this document, and typos may escape review. Please contact Dr. Lyn Hollingshead for any needed clarifications.

## 2017-06-06 ENCOUNTER — Encounter: Payer: Self-pay | Admitting: Physician Assistant

## 2017-06-06 DIAGNOSIS — E559 Vitamin D deficiency, unspecified: Secondary | ICD-10-CM | POA: Insufficient documentation

## 2017-06-06 DIAGNOSIS — R7989 Other specified abnormal findings of blood chemistry: Secondary | ICD-10-CM | POA: Insufficient documentation

## 2017-06-06 DIAGNOSIS — R7301 Impaired fasting glucose: Secondary | ICD-10-CM | POA: Insufficient documentation

## 2017-06-06 DIAGNOSIS — R5383 Other fatigue: Secondary | ICD-10-CM | POA: Insufficient documentation

## 2017-06-06 LAB — COMPREHENSIVE METABOLIC PANEL
AG Ratio: 1.6 (calc) (ref 1.0–2.5)
ALBUMIN MSPROF: 4.3 g/dL (ref 3.6–5.1)
ALT: 68 U/L — AB (ref 9–46)
AST: 28 U/L (ref 10–40)
Alkaline phosphatase (APISO): 82 U/L (ref 40–115)
BUN: 12 mg/dL (ref 7–25)
CO2: 31 mmol/L (ref 20–32)
CREATININE: 1.05 mg/dL (ref 0.60–1.35)
Calcium: 9.6 mg/dL (ref 8.6–10.3)
Chloride: 103 mmol/L (ref 98–110)
Globulin: 2.7 g/dL (calc) (ref 1.9–3.7)
Glucose, Bld: 108 mg/dL — ABNORMAL HIGH (ref 65–99)
POTASSIUM: 4.3 mmol/L (ref 3.5–5.3)
Sodium: 140 mmol/L (ref 135–146)
Total Bilirubin: 0.4 mg/dL (ref 0.2–1.2)
Total Protein: 7 g/dL (ref 6.1–8.1)

## 2017-06-06 LAB — FERRITIN: Ferritin: 591 ng/mL — ABNORMAL HIGH (ref 20–345)

## 2017-06-06 LAB — CBC
HCT: 47.5 % (ref 38.5–50.0)
Hemoglobin: 16.7 g/dL (ref 13.2–17.1)
MCH: 30.1 pg (ref 27.0–33.0)
MCHC: 35.2 g/dL (ref 32.0–36.0)
MCV: 85.7 fL (ref 80.0–100.0)
MPV: 10 fL (ref 7.5–12.5)
PLATELETS: 210 10*3/uL (ref 140–400)
RBC: 5.54 10*6/uL (ref 4.20–5.80)
RDW: 12.8 % (ref 11.0–15.0)
WBC: 6.3 10*3/uL (ref 3.8–10.8)

## 2017-06-06 LAB — SEDIMENTATION RATE: SED RATE: 6 mm/h (ref 0–15)

## 2017-06-06 LAB — VITAMIN D 25 HYDROXY (VIT D DEFICIENCY, FRACTURES): Vit D, 25-Hydroxy: 14 ng/mL — ABNORMAL LOW (ref 30–100)

## 2017-06-06 LAB — C-REACTIVE PROTEIN: CRP: 6.7 mg/L (ref <8.0)

## 2017-06-06 LAB — TSH: TSH: 1.51 m[IU]/L (ref 0.40–4.50)

## 2017-06-06 LAB — VITAMIN B12: VITAMIN B 12: 490 pg/mL (ref 200–1100)

## 2017-06-06 LAB — TESTOSTERONE: TESTOSTERONE: 115 ng/dL — AB (ref 250–827)

## 2017-06-06 MED ORDER — ERGOCALCIFEROL 1.25 MG (50000 UT) PO CAPS
50000.0000 [IU] | ORAL_CAPSULE | ORAL | 0 refills | Status: DC
Start: 2017-06-06 — End: 2017-09-30

## 2017-06-13 ENCOUNTER — Ambulatory Visit (INDEPENDENT_AMBULATORY_CARE_PROVIDER_SITE_OTHER): Payer: Federal, State, Local not specified - PPO | Admitting: Physician Assistant

## 2017-06-13 ENCOUNTER — Encounter: Payer: Self-pay | Admitting: Physician Assistant

## 2017-06-13 VITALS — BP 152/73 | HR 95 | Ht 70.0 in | Wt 288.0 lb

## 2017-06-13 DIAGNOSIS — F4321 Adjustment disorder with depressed mood: Secondary | ICD-10-CM

## 2017-06-13 DIAGNOSIS — E559 Vitamin D deficiency, unspecified: Secondary | ICD-10-CM | POA: Diagnosis not present

## 2017-06-13 DIAGNOSIS — F5102 Adjustment insomnia: Secondary | ICD-10-CM

## 2017-06-13 DIAGNOSIS — R7301 Impaired fasting glucose: Secondary | ICD-10-CM

## 2017-06-13 DIAGNOSIS — R7989 Other specified abnormal findings of blood chemistry: Secondary | ICD-10-CM

## 2017-06-13 LAB — POCT GLYCOSYLATED HEMOGLOBIN (HGB A1C): HEMOGLOBIN A1C: 5.4

## 2017-06-13 NOTE — Patient Instructions (Signed)
Hemochromatosis Hemochromatosis, also called iron storage disease, is a condition in which the body stores too much iron. The excess iron builds up in your joints, heart, liver, pancreas, and other organs and damages them. What are the causes? Hemochromatosis may be caused by:  Abnormal genes. These are passed down (inherited) from both of your parents.  Having blood transfusions.  Having too much iron in your diet.  What increases the risk?  Being Caucasian.  Inheriting abnormal genes from both your parents.  Having a severe or long-term loss of red blood cells (anemia). What are the signs or symptoms? Signs and symptoms can start at any age, but usually start in middle age. They may include:  Fatigue.  Weakness.  Joint pain.  Abdominal pain.  Weight loss.  Gray or bronze skin coloring.  Loss of interest in sex.  Loss of menstrual periods in women.  Loss of body hair.  Shortness of breath.  Late signs of hemochromatosis include damage to the liver, heart, or pancreas. This may lead to:  Liver cancer.  Abnormal heart rhythms.  Heart failure.  Diabetes.  How is this diagnosed? Your health care provider will perform a physical exam and ask about your symptoms and family history. Tests may be done to confirm the diagnosis. Blood tests may include:  Transferrin saturation. This test measures how much iron is bound to hemoglobin in your blood. Hemoglobin is a substance in red blood cells that carries oxygen to the tissues of the body.  Serum ferritin. This test measures a protein that stores iron in your blood.  A test to check for the abnormal genes that cause the condition.  You may have a tissue sample taken from your liver with a needle (biopsy) for testing. The results will show if iron is building up in your liver. How is this treated? Hemochromatosis is treated by removing iron by taking blood (phlebotomy). Having a phlebotomy is similar to having blood  taken for donation. The procedure is simple and effective as long as it is started before organ damage develops. When you begin treatment, you may have a pint of blood removed once or twice a week. You may have blood tests done to determine when your iron levels return to normal. Once your iron levels are normal, you may only need to have a phlebotomy every few months. Follow these instructions at home:  Do not eat a lot of foods that are high in iron. Iron-rich foods include red meats and organ meats.  Do not take dietary supplements that contain iron.  Do not take vitamin C supplements. Vitamin C increases iron absorption.  Do not eat raw shellfish or raw fish. Hemochromatosis may increase your chance of infection from these foods.  If you have liver damage, do not drink any alcohol.  If you do not have liver damage, limit alcohol intake to no more than 1 drink per day for nonpregnant women and 2 drinks per day for men. One drink equals 12 ounces of beer, 5 ounces of wine, or 1 ounces of hard liquor.  Try to exercise for at least 30 minutes on most days of the week.  Keep all follow-up visits as directed by your health care provider. This is important. Contact a health care provider if:  You have fatigue.  You have weakness.  You have joint pain.  You have abdominal pain.  You experience weight loss.  You have shortness of breath. Get help right away if:  You have   chest pain.  You have trouble breathing. This information is not intended to replace advice given to you by your health care provider. Make sure you discuss any questions you have with your health care provider. Document Released: 04/19/2000 Document Revised: 09/28/2015 Document Reviewed: 06/16/2013 Elsevier Interactive Patient Education  2018 Elsevier Inc.  

## 2017-06-13 NOTE — Progress Notes (Signed)
Subjective:    Patient ID: Brandon Baker, male    DOB: 07/12/1980, 37 y.o.   MRN: [id number]  HPI  Pt is a 37 yo morbidly obese male who presents to the clinic to discuss labs that were recently drawn.   Pt is interested in starting testosterone replacement. He does not want any more children.   Pt was told this weekend that his wife was going to leave him and wanted divorce. On top of how tired he is and everything going on now he is trying to make his marriage not "fall apart". He does feel very overwhelmed. Denies any SI/HC thoughts.   .. Active Ambulatory Problems    Diagnosis Date Noted  . Tobacco abuse 08/07/2012  . Chigger bite 11/24/2014  . Low back strain 08/01/2015  . Muscle spasm of back 08/01/2015  . Midline low back pain without sciatica 08/07/2015  . OSA (obstructive sleep apnea) 03/25/2016  . Situational depression 06/04/2017  . Adjustment insomnia 06/04/2017  . Morbid obesity (HCC) 06/04/2017  . Elevated ferritin 06/06/2017  . Elevated fasting glucose 06/06/2017  . Vitamin D deficiency 06/06/2017  . No energy 06/06/2017   Resolved Ambulatory Problems    Diagnosis Date Noted  . No Resolved Ambulatory Problems   No Additional Past Medical History       Review of Systems  Respiratory: Positive for apnea.   Cardiovascular: Negative.   Endocrine: Negative.   Psychiatric/Behavioral: Positive for decreased concentration and sleep disturbance. Negative for suicidal ideas.       Objective:   Physical Exam  Constitutional: He is oriented to person, place, and time. He appears well-developed and well-nourished.  Obese.   HENT:  Head: Normocephalic and atraumatic.  Cardiovascular: Normal rate, regular rhythm and normal heart sounds.  Neurological: He is alert and oriented to person, place, and time.  Psychiatric: His behavior is normal.  Flat affect today           Assessment & Plan:  Marland Kitchen.Marland Kitchen.Diagnoses and all orders for this visit:  Decreased  testosterone level -     Testosterone -     PSA  IFG (impaired fasting glucose) -     POCT HgB A1C  Elevated ferritin  Morbid obesity (HCC)  Vitamin D deficiency  Situational depression  Adjustment insomnia  .Marland Kitchen. Depression screen PHQ 2/9 06/03/2017  Decreased Interest 3  Down, Depressed, Hopeless 2  PHQ - 2 Score 5  Altered sleeping 3  Tired, decreased energy 3  Change in appetite 3  Feeling bad or failure about yourself  0  Trouble concentrating 3  Moving slowly or fidgety/restless 2  Suicidal thoughts 0  PHQ-9 Score 19  Difficult doing work/chores Very difficult     Lab Results  Component Value Date   HGBA1C 5.4 06/13/2017   A!C is reassuring. Trending towards diabetes but with proper diet changes this can be corrected. Normal range now.   We do have one low testosterone. Will recheck in 2 weeks and if still low consider replacement. We will have to closely watch hematocrit due to elevated ferritin and hemoglobin.   Pt has started vitamin D weekly.   Encouraged to start trazodone for sleep. AND to wear his CPAP machine.   Pt agreed to start prozac during this difficult transition. Discussed side effects. Follow up in 6 weeks.   Elevated ferritin. suspicious of hemochromatosis. He is of white ancestory, end of 3rd decade which fit demographics. There can also be a link with testosterone and ferritin.  Will make referral.   ..Spent 30 minutes with patient and greater than 50 percent of visit spent counseling patient regarding treatment plan.

## 2017-06-13 NOTE — Progress Notes (Signed)
l °

## 2017-06-15 ENCOUNTER — Encounter: Payer: Self-pay | Admitting: Physician Assistant

## 2017-06-17 ENCOUNTER — Encounter: Payer: Self-pay | Admitting: Physician Assistant

## 2017-06-17 DIAGNOSIS — R7989 Other specified abnormal findings of blood chemistry: Secondary | ICD-10-CM | POA: Insufficient documentation

## 2017-06-17 DIAGNOSIS — R7301 Impaired fasting glucose: Secondary | ICD-10-CM | POA: Insufficient documentation

## 2017-06-20 DIAGNOSIS — R7989 Other specified abnormal findings of blood chemistry: Secondary | ICD-10-CM | POA: Diagnosis not present

## 2017-06-21 LAB — PSA: PSA: 1.8 ng/mL (ref <=4.0)

## 2017-06-21 LAB — TESTOSTERONE: TESTOSTERONE: 162 ng/dL — AB (ref 250–827)

## 2017-06-23 ENCOUNTER — Encounter: Payer: Self-pay | Admitting: Physician Assistant

## 2017-06-23 NOTE — Progress Notes (Signed)
Please send zoloft 50mg  daily #30 1 RF  Testosterone still low. PSA is normal. Would you like to replace testosterone with gel or shots?

## 2017-06-24 ENCOUNTER — Encounter: Payer: Self-pay | Admitting: Physician Assistant

## 2017-06-24 ENCOUNTER — Other Ambulatory Visit: Payer: Self-pay | Admitting: *Deleted

## 2017-06-24 MED ORDER — TESTOSTERONE 40.5 MG/2.5GM (1.62%) TD GEL: 1.0000 "application " | Freq: Every day | TRANSDERMAL | 5 refills | Status: DC

## 2017-06-24 MED ORDER — SERTRALINE HCL 50 MG PO TABS: 50.0000 mg | ORAL_TABLET | Freq: Every day | ORAL | 1 refills | Status: DC

## 2017-06-27 NOTE — Telephone Encounter (Signed)
[facility] from 05/26/2017 until 12/22/2017. Pharmacy notified.   Reference: [id number].

## 2017-07-02 ENCOUNTER — Other Ambulatory Visit: Payer: Self-pay | Admitting: Physician Assistant

## 2017-07-29 ENCOUNTER — Encounter: Payer: Self-pay | Admitting: Physician Assistant

## 2017-07-30 MED ORDER — ESCITALOPRAM OXALATE 10 MG PO TABS: 10.0000 mg | ORAL_TABLET | Freq: Every day | ORAL | 1 refills | Status: DC

## 2017-08-15 ENCOUNTER — Other Ambulatory Visit: Payer: Self-pay | Admitting: Physician Assistant

## 2017-08-15 ENCOUNTER — Telehealth: Payer: Self-pay | Admitting: Physician Assistant

## 2017-08-15 DIAGNOSIS — R7989 Other specified abnormal findings of blood chemistry: Secondary | ICD-10-CM

## 2017-08-15 DIAGNOSIS — R748 Abnormal levels of other serum enzymes: Secondary | ICD-10-CM

## 2017-08-15 NOTE — Telephone Encounter (Signed)
Patient states he was not sure if he was to find a Hematologist or are we going to send a referral. Referral sent for elevated ferritin and liver enzymes and questionable hemochromatosis.

## 2017-08-15 NOTE — Telephone Encounter (Signed)
Do you have appt with hematology for hemochromatosis? I didn't see one.

## 2017-08-15 NOTE — Telephone Encounter (Signed)
Sorry I thought referral was made. Yes you do need to be seen.

## 2017-08-15 NOTE — Telephone Encounter (Signed)
Left message for a return call

## 2017-08-18 NOTE — Telephone Encounter (Signed)
Left a message advising of referral.  

## 2017-09-10 DIAGNOSIS — F4321 Adjustment disorder with depressed mood: Secondary | ICD-10-CM | POA: Diagnosis not present

## 2017-09-24 DIAGNOSIS — F4321 Adjustment disorder with depressed mood: Secondary | ICD-10-CM | POA: Diagnosis not present

## 2017-09-30 ENCOUNTER — Encounter: Payer: Self-pay | Admitting: Physician Assistant

## 2017-09-30 ENCOUNTER — Ambulatory Visit (INDEPENDENT_AMBULATORY_CARE_PROVIDER_SITE_OTHER): Payer: Federal, State, Local not specified - PPO | Admitting: Physician Assistant

## 2017-09-30 VITALS — BP 134/68 | HR 80 | Ht 70.0 in | Wt 275.0 lb

## 2017-09-30 DIAGNOSIS — F988 Other specified behavioral and emotional disorders with onset usually occurring in childhood and adolescence: Secondary | ICD-10-CM | POA: Diagnosis not present

## 2017-09-30 DIAGNOSIS — G4733 Obstructive sleep apnea (adult) (pediatric): Secondary | ICD-10-CM | POA: Diagnosis not present

## 2017-09-30 DIAGNOSIS — F4321 Adjustment disorder with depressed mood: Secondary | ICD-10-CM | POA: Diagnosis not present

## 2017-09-30 MED ORDER — AMPHETAMINE-DEXTROAMPHET ER 20 MG PO CP24: 20.0000 mg | ORAL_CAPSULE | ORAL | 0 refills | Status: DC

## 2017-09-30 NOTE — Progress Notes (Signed)
   Subjective:    Patient ID: Brandon Baker, male    DOB: 08-Jan-1981, 37 y.o.   MRN: [id number]  HPI  Pt is a 37 yo male who presents to the clinic to discuss lack of focus, no energy, not being able to complete task. As a child he took ritalin for ADHD. He stopped in adulthood. He is now going back to school and working and finding it very hard to complete all the task. He has OSA and uses CPAP every night. He is going through a lot with divorce but tried zoloft and stopped due to side effects. lexapro sent and was just scared to start. Overall he feels like depression/anxiety is controlled.no SI/Hc.   Marland Kitchen. Active Ambulatory Problems    Diagnosis Date Noted  . Tobacco abuse 08/07/2012  . Chigger bite 11/24/2014  . Low back strain 08/01/2015  . Muscle spasm of back 08/01/2015  . Midline low back pain without sciatica 08/07/2015  . OSA (obstructive sleep apnea) 03/25/2016  . Situational depression 06/04/2017  . Adjustment insomnia 06/04/2017  . Morbid obesity (HCC) 06/04/2017  . Elevated ferritin 06/06/2017  . Elevated fasting glucose 06/06/2017  . Vitamin D deficiency 06/06/2017  . No energy 06/06/2017  . Decreased testosterone level 06/17/2017  . IFG (impaired fasting glucose) 06/17/2017   Resolved Ambulatory Problems    Diagnosis Date Noted  . No Resolved Ambulatory Problems   No Additional Past Medical History     Review of Systems  All other systems reviewed and are negative.      Objective:   Physical Exam  Constitutional: He appears well-developed and well-nourished.  Obese.   HENT:  Head: Normocephalic and atraumatic.  Cardiovascular: Normal rate and regular rhythm.  Pulmonary/Chest: Effort normal and breath sounds normal.  Psychiatric: He has a normal mood and affect. His behavior is normal.          Assessment & Plan:  Marland KitchenMarland KitchenDiagnoses and all orders for this visit:  Attention deficit disorder (ADD) without hyperactivity -     amphetamine-dextroamphetamine  (ADDERALL XR) 20 MG 24 hr capsule; Take 1 capsule (20 mg total) by mouth every morning.  OSA (obstructive sleep apnea)   .Marland Kitchen Depression screen [facility] 2/9 09/30/2017 06/03/2017  Decreased Interest 1 3  Down, Depressed, Hopeless 1 2  PHQ - 2 Score 2 5  Altered sleeping 1 3  Tired, decreased energy 1 3  Change in appetite 1 3  Feeling bad or failure about yourself  1 0  Trouble concentrating - 3  Moving slowly or fidgety/restless 0 2  Suicidal thoughts 0 0  PHQ-9 Score 6 19  Difficult doing work/chores Somewhat difficult Very difficult   .Marland Kitchen GAD 7 : Generalized Anxiety Score 09/30/2017 06/03/2017  Nervous, Anxious, on Edge 1 2  Control/stop worrying 1 1  Worry too much - different things 1 1  Trouble relaxing 1 2  Restless 1 3  Easily annoyed or irritable 1 3  Afraid - awful might happen 0 0  Total GAD 7 Score 6 12  Anxiety Difficulty Somewhat difficult -   Follow up in one month.  adderall started. Discussed side effects.  Will monitor anxiety/depression increase.  Continue to foster good sleep.

## 2017-10-01 ENCOUNTER — Telehealth: Payer: Self-pay | Admitting: Physician Assistant

## 2017-10-01 NOTE — Telephone Encounter (Signed)
Adderall approved from 09/01/2017 through 10/01/2018. Pharmacy notified.

## 2017-10-08 DIAGNOSIS — F4321 Adjustment disorder with depressed mood: Secondary | ICD-10-CM | POA: Diagnosis not present

## 2017-10-30 DIAGNOSIS — F4321 Adjustment disorder with depressed mood: Secondary | ICD-10-CM | POA: Diagnosis not present

## 2017-10-31 ENCOUNTER — Ambulatory Visit: Payer: Federal, State, Local not specified - PPO | Admitting: Physician Assistant

## 2017-10-31 ENCOUNTER — Encounter: Payer: Self-pay | Admitting: Physician Assistant

## 2017-10-31 VITALS — BP 143/73 | HR 80 | Wt 273.0 lb

## 2017-10-31 DIAGNOSIS — E291 Testicular hypofunction: Secondary | ICD-10-CM | POA: Insufficient documentation

## 2017-10-31 DIAGNOSIS — G4733 Obstructive sleep apnea (adult) (pediatric): Secondary | ICD-10-CM | POA: Diagnosis not present

## 2017-10-31 DIAGNOSIS — F988 Other specified behavioral and emotional disorders with onset usually occurring in childhood and adolescence: Secondary | ICD-10-CM | POA: Diagnosis not present

## 2017-10-31 DIAGNOSIS — R7989 Other specified abnormal findings of blood chemistry: Secondary | ICD-10-CM

## 2017-10-31 MED ORDER — AMPHETAMINE-DEXTROAMPHET ER 20 MG PO CP24: 20.0000 mg | ORAL_CAPSULE | ORAL | 0 refills | Status: DC

## 2017-10-31 NOTE — Progress Notes (Signed)
Subjective:    Patient ID: Brandon Baker, male    DOB: 08-Jul-1980, 37 y.o.   MRN: [id number]  HPI  Patient is a 37 year old male who presents to the clinic to follow-up on Adderall start.  After starting Adderall patient is doing significantly better.  He is currently working full-time and in school.  He has noticed a lot of improvement in his schoolwork.  He denies any increased problems with sleep, palpitations, anxiety.  He is very happy with his response.  It seems to last around 10 hours.  This sometimes leave him searching for focus after he puts his son to bed.  Patient is trying to overall be healthier.  He is using his testosterone daily.  He admits he is not using his CPAP.  He has lost 14 pounds since February.  .. Active Ambulatory Problems    Diagnosis Date Noted  . Tobacco abuse 08/07/2012  . Chigger bite 11/24/2014  . Low back strain 08/01/2015  . Muscle spasm of back 08/01/2015  . Midline low back pain without sciatica 08/07/2015  . OSA (obstructive sleep apnea) 03/25/2016  . Situational depression 06/04/2017  . Adjustment insomnia 06/04/2017  . Morbid obesity (HCC) 06/04/2017  . Elevated ferritin 06/06/2017  . Elevated fasting glucose 06/06/2017  . Vitamin D deficiency 06/06/2017  . No energy 06/06/2017  . Decreased testosterone level 06/17/2017  . IFG (impaired fasting glucose) 06/17/2017  . Hypogonadism in male 10/31/2017  . Attention deficit disorder (ADD) without hyperactivity 10/31/2017   Resolved Ambulatory Problems    Diagnosis Date Noted  . No Resolved Ambulatory Problems   No Additional Past Medical History     Review of Systems  All other systems reviewed and are negative.      Objective:   Physical Exam  Constitutional: He is oriented to person, place, and time. He appears well-developed and well-nourished.  HENT:  Head: Normocephalic and atraumatic.  Cardiovascular: Normal rate and regular rhythm.  Pulmonary/Chest: Effort normal and  breath sounds normal.  Neurological: He is alert and oriented to person, place, and time.  Psychiatric: He has a normal mood and affect. His behavior is normal.          Assessment & Plan:  Marland KitchenMarland KitchenBarak was seen today for follow-up.  Diagnoses and all orders for this visit:  Attention deficit disorder (ADD) without hyperactivity -     amphetamine-dextroamphetamine (ADDERALL XR) 20 MG 24 hr capsule; Take 1 capsule (20 mg total) by mouth every morning. -     amphetamine-dextroamphetamine (ADDERALL XR) 20 MG 24 hr capsule; Take 1 capsule (20 mg total) by mouth every morning. -     amphetamine-dextroamphetamine (ADDERALL XR) 20 MG 24 hr capsule; Take 1 capsule (20 mg total) by mouth every morning.  Hypogonadism in male -     Testosterone -     CBC with Differential/Platelet -     PSA  Elevated ferritin -     Ferritin -     CBC with Differential/Platelet  OSA (obstructive sleep apnea)  Morbid obesity (HCC)   Patient reports to be doing really well since starting the Adderall.  He denies any increase in anxiety or depression.  In fact he feels like his symptoms have improved in the mood department.  Refilled for 3 months.  New labs ordered for testosterone management.  He has been on for a few months and we need to recheck these levels and see if there is needs to be any adjustment.  Strongly encourage patient  to wear CPAP as this could help with his energy level, focus and weight loss.  Ordered labs to follow-up on elevated ferritin.  He has not seen hematology at this time.  I would like to see how his iron stores are doing. Follow-up in 3 months.

## 2017-11-19 DIAGNOSIS — F4321 Adjustment disorder with depressed mood: Secondary | ICD-10-CM | POA: Diagnosis not present

## 2017-11-30 ENCOUNTER — Other Ambulatory Visit: Payer: Self-pay | Admitting: Physician Assistant

## 2017-11-30 DIAGNOSIS — F988 Other specified behavioral and emotional disorders with onset usually occurring in childhood and adolescence: Secondary | ICD-10-CM

## 2017-12-02 ENCOUNTER — Encounter: Payer: Self-pay | Admitting: Physician Assistant

## 2017-12-03 DIAGNOSIS — F4321 Adjustment disorder with depressed mood: Secondary | ICD-10-CM | POA: Diagnosis not present

## 2017-12-08 ENCOUNTER — Encounter: Payer: Self-pay | Admitting: Emergency Medicine

## 2017-12-08 ENCOUNTER — Other Ambulatory Visit: Payer: Self-pay

## 2017-12-08 ENCOUNTER — Emergency Department
Admission: EM | Admit: 2017-12-08 | Discharge: 2017-12-08 | Disposition: A | Discharge status: Home / Self Care | Payer: Federal, State, Local not specified - PPO | Source: Home / Self Care | Attending: Family Medicine | Admitting: Family Medicine

## 2017-12-08 ENCOUNTER — Emergency Department (INDEPENDENT_AMBULATORY_CARE_PROVIDER_SITE_OTHER): Payer: Federal, State, Local not specified - PPO

## 2017-12-08 DIAGNOSIS — S91331A Puncture wound without foreign body, right foot, initial encounter: Secondary | ICD-10-CM

## 2017-12-08 DIAGNOSIS — M79671 Pain in right foot: Secondary | ICD-10-CM | POA: Diagnosis not present

## 2017-12-08 DIAGNOSIS — Z23 Encounter for immunization: Secondary | ICD-10-CM | POA: Diagnosis not present

## 2017-12-08 DIAGNOSIS — S99921A Unspecified injury of right foot, initial encounter: Secondary | ICD-10-CM | POA: Diagnosis not present

## 2017-12-08 MED ORDER — CEPHALEXIN 500 MG PO CAPS: 500.0000 mg | ORAL_CAPSULE | Freq: Two times a day (BID) | ORAL | 0 refills | Status: DC

## 2017-12-08 MED ORDER — IBUPROFEN 600 MG PO TABS: 600.0000 mg | ORAL_TABLET | Freq: Four times a day (QID) | ORAL | 0 refills | Status: DC | PRN

## 2017-12-08 MED ORDER — TETANUS-DIPHTH-ACELL PERTUSSIS 5-2.5-18.5 LF-MCG/0.5 IM SUSP
0.5000 mL | Freq: Once | INTRAMUSCULAR | Status: AC
  Administered 2017-12-08: 0.5 mL via INTRAMUSCULAR

## 2017-12-08 NOTE — ED Provider Notes (Signed)
[facility]    CSN: [id number] Arrival date & time: 12/08/17  1039     History   Chief Complaint Chief Complaint  Patient presents with  . Foot Pain    right    HPI Brandon Baker is a 37 y.o. male.   HPI Brandon Baker is a 37 y.o. male presenting to UC with c/o Right foot pain that is aching and sore on the top of his mid foot, gradually worsening over the last 2 days after he jumped off a 10 foot bridge and landed barefoot on a rock.  Pain is worse with weightbearing. He has a wound on the bottom of his foot. No active bleeding. He is unsure of last tetanus.    History reviewed. No pertinent past medical history.  Patient Active Problem List   Diagnosis Date Noted  . Hypogonadism in male 10/31/2017  . Attention deficit disorder (ADD) without hyperactivity 10/31/2017  . Decreased testosterone level 06/17/2017  . IFG (impaired fasting glucose) 06/17/2017  . Elevated ferritin 06/06/2017  . Elevated fasting glucose 06/06/2017  . Vitamin D deficiency 06/06/2017  . No energy 06/06/2017  . Situational depression 06/04/2017  . Adjustment insomnia 06/04/2017  . Morbid obesity (HCC) 06/04/2017  . OSA (obstructive sleep apnea) 03/25/2016  . Midline low back pain without sciatica 08/07/2015  . Low back strain 08/01/2015  . Muscle spasm of back 08/01/2015  . Chigger bite 11/24/2014  . Tobacco abuse 08/07/2012    History reviewed. No pertinent surgical history.     Home Medications    Prior to Admission medications   Medication Sig Start Date End Date Taking? Authorizing Provider  AMBULATORY NON FORMULARY MEDICATION Trial of CPAP therapy on 13 cm H2O with a Small size Resmed Full Face Mask AirFit F20 mask and heated humidification. 03/27/16   Breeback, Lonna Cobb, PA-C  amphetamine-dextroamphetamine (ADDERALL XR) 20 MG 24 hr capsule Take 1 capsule (20 mg total) by mouth every morning. 10/31/17   Breeback, Jade L, PA-C  amphetamine-dextroamphetamine (ADDERALL XR) 20  MG 24 hr capsule Take 1 capsule (20 mg total) by mouth every morning. 11/30/17   Breeback, Jade L, PA-C  amphetamine-dextroamphetamine (ADDERALL XR) 20 MG 24 hr capsule Take 1 capsule (20 mg total) by mouth every morning. 12/31/17   Breeback, Lonna Cobb, PA-C  cephALEXin (KEFLEX) 500 MG capsule Take 1 capsule (500 mg total) by mouth 2 (two) times daily. 12/08/17   Lurene Shadow, PA-C  ibuprofen (ADVIL,MOTRIN) 600 MG tablet Take 1 tablet (600 mg total) by mouth every 6 (six) hours as needed. 12/08/17   Lurene Shadow, PA-C  Testosterone 40.5 MG/2.5GM (1.62%) GEL Place 1 application onto the skin daily. 06/24/17   Jomarie Longs, PA-C    Family History Family History  Problem Relation Age of Onset  . Alcohol abuse Mother   . Alcohol abuse Father   . Heart attack Maternal Uncle   . Hypertension Maternal Uncle   . Diabetes Maternal Grandmother   . Hyperlipidemia Maternal Grandmother   . Hypertension Maternal Grandmother   . Heart attack Maternal Grandfather   . Diabetes Maternal Grandfather   . Hyperlipidemia Maternal Grandfather   . Hypertension Maternal Grandfather     Social History Social History   Tobacco Use  . Smoking status: Former Smoker    Packs/day: 1.00    Types: Cigarettes, E-cigarettes    Last attempt to quit: 07/05/2014    Years since quitting: 3.4  . Smokeless tobacco: Never Used  Substance  Use Topics  . Alcohol use: Yes    Alcohol/week: 0.0 oz  . Drug use: Not Currently     Allergies   Patient has no known allergies.   Review of Systems Review of Systems  Constitutional: Negative for chills and fever.  Musculoskeletal: Positive for arthralgias, joint swelling and myalgias.  Skin: Positive for wound. Negative for color change.     Physical Exam Triage Vital Signs ED Triage Vitals [12/08/17 1056]  Enc Vitals Group     BP (!) 147/88     Pulse Rate 96     Resp      Temp 98.2 F (36.8 C)     Temp Source Oral     SpO2 100 %     Weight 267 lb (121.1 kg)      Height 5\' 9"  (1.753 m)     Head Circumference      Peak Flow      Pain Score 6     Pain Loc      Pain Edu?      Excl. in GC?    No data found.  Updated Vital Signs BP (!) 147/88 (BP Location: Right Arm)   Pulse 96   Temp 98.2 F (36.8 C) (Oral)   Ht 5\' 9"  (1.753 m)   Wt 267 lb (121.1 kg)   SpO2 100%   BMI 39.43 kg/m   Visual Acuity Right Eye Distance:   Left Eye Distance:   Bilateral Distance:    Right Eye Near:   Left Eye Near:    Bilateral Near:     Physical Exam  Constitutional: He is oriented to person, place, and time. He appears well-developed and well-nourished. No distress.  HENT:  Head: Normocephalic and atraumatic.  Eyes: EOM are normal.  Neck: Normal range of motion.  Cardiovascular: Normal rate.  Pulmonary/Chest: Effort normal.  Musculoskeletal: He exhibits edema and tenderness.  Right foot: mild edema. Tenderness to dorsal mid foot. Decreased ROM toes due to pain into foot.  Ankle and calf are non-tender.  Neurological: He is alert and oriented to person, place, and time.  Skin: Skin is warm and dry. He is not diaphoretic.  Right foot: plantar aspect- 1cm linear laceration/puncture wound. No active bleeding. Minimally tender. No erythema or discharge.   Psychiatric: He has a normal mood and affect. His behavior is normal.  Nursing note and vitals reviewed.    UC Treatments / Results  Labs (all labs ordered are listed, but only abnormal results are displayed) Labs Reviewed - No data to display  EKG None  Radiology Dg Foot Complete Right  Result Date: 12/08/2017 CLINICAL DATA:  Right foot pain after jumping injury 2 days ago. EXAM: RIGHT FOOT COMPLETE - 3+ VIEW COMPARISON:  None. FINDINGS: There is no evidence of fracture or dislocation. There is no evidence of arthropathy or other focal bone abnormality. Soft tissues are unremarkable. IMPRESSION: Normal right foot. Electronically Signed   By: Lupita Raider, M.D.   On: 12/08/2017 11:31     Procedures Procedures (including critical care time)  Medications Ordered in UC Medications  Tdap (BOOSTRIX) injection 0.5 mL (0.5 mLs Intramuscular Given 12/08/17 1115)    Initial Impression / Assessment and Plan / UC Course  I have reviewed the triage vital signs and the nursing notes.  Pertinent labs & imaging results that were available during my care of the patient were reviewed by me and considered in my medical decision making (see chart for details).  Discussed imaging with pt Ace wrap applied for comfort Tdap updated Will start on prophylactic cephalexin due to MOI  Final Clinical Impressions(s) / UC Diagnoses   Final diagnoses:  Right foot pain  Puncture wound of right foot, initial encounter     Discharge Instructions      You may take 500mg  acetaminophen every 4-6 hours or in combination with ibuprofen 400-600mg  every 6-8 hours as needed for pain and inflammation.  Be sure to keep wound clean with warm water and mild soap and take the antibiotic as prescribed to help prevent infection. Please follow up if symptoms not improving in 1 week, sooner if worsening.      ED Prescriptions    Medication Sig Dispense Auth. Provider   cephALEXin (KEFLEX) 500 MG capsule Take 1 capsule (500 mg total) by mouth 2 (two) times daily. 14 capsule Doroteo GlassmanPhelps, Kaian Fahs O, PA-C   ibuprofen (ADVIL,MOTRIN) 600 MG tablet Take 1 tablet (600 mg total) by mouth every 6 (six) hours as needed. 30 tablet Lurene ShadowPhelps, Christabell Loseke O, PA-C     Controlled Substance Prescriptions [place] Controlled Substance Registry consulted? Not Applicable   Rolla Platehelps, Jakaylah Schlafer O, PA-C 12/08/17 1222

## 2017-12-08 NOTE — Discharge Instructions (Signed)
°  You may take 500mg  acetaminophen every 4-6 hours or in combination with ibuprofen 400-600mg  every 6-8 hours as needed for pain and inflammation.  Be sure to keep wound clean with warm water and mild soap and take the antibiotic as prescribed to help prevent infection. Please follow up if symptoms not improving in 1 week, sooner if worsening.

## 2017-12-08 NOTE — ED Triage Notes (Signed)
Pt c/o of right foot pain. States he jumped off dock and landed on a rock.

## 2017-12-17 DIAGNOSIS — F4321 Adjustment disorder with depressed mood: Secondary | ICD-10-CM | POA: Diagnosis not present

## 2018-01-07 DIAGNOSIS — F4321 Adjustment disorder with depressed mood: Secondary | ICD-10-CM | POA: Diagnosis not present

## 2018-01-12 ENCOUNTER — Other Ambulatory Visit: Payer: Self-pay | Admitting: Physician Assistant

## 2018-01-12 ENCOUNTER — Encounter: Payer: Self-pay | Admitting: Physician Assistant

## 2018-01-12 DIAGNOSIS — F988 Other specified behavioral and emotional disorders with onset usually occurring in childhood and adolescence: Secondary | ICD-10-CM

## 2018-02-02 DIAGNOSIS — F4321 Adjustment disorder with depressed mood: Secondary | ICD-10-CM | POA: Diagnosis not present

## 2018-02-17 ENCOUNTER — Encounter: Payer: Self-pay | Admitting: Physician Assistant

## 2018-02-17 DIAGNOSIS — F988 Other specified behavioral and emotional disorders with onset usually occurring in childhood and adolescence: Secondary | ICD-10-CM

## 2018-02-17 MED ORDER — AMPHETAMINE-DEXTROAMPHET ER 20 MG PO CP24: 20.0000 mg | ORAL_CAPSULE | ORAL | 0 refills | Status: DC

## 2018-02-24 ENCOUNTER — Ambulatory Visit: Payer: Federal, State, Local not specified - PPO | Admitting: Physician Assistant

## 2018-03-04 ENCOUNTER — Ambulatory Visit: Payer: Federal, State, Local not specified - PPO | Admitting: Physician Assistant

## 2018-03-04 ENCOUNTER — Encounter: Payer: Self-pay | Admitting: Physician Assistant

## 2018-03-04 VITALS — BP 143/89 | HR 78 | Temp 97.8°F | Ht 70.0 in | Wt 265.0 lb

## 2018-03-04 DIAGNOSIS — F988 Other specified behavioral and emotional disorders with onset usually occurring in childhood and adolescence: Secondary | ICD-10-CM | POA: Diagnosis not present

## 2018-03-04 DIAGNOSIS — R7989 Other specified abnormal findings of blood chemistry: Secondary | ICD-10-CM | POA: Diagnosis not present

## 2018-03-04 DIAGNOSIS — E291 Testicular hypofunction: Secondary | ICD-10-CM

## 2018-03-04 DIAGNOSIS — G4733 Obstructive sleep apnea (adult) (pediatric): Secondary | ICD-10-CM

## 2018-03-04 MED ORDER — AMPHETAMINE-DEXTROAMPHET ER 30 MG PO CP24: 30.0000 mg | ORAL_CAPSULE | ORAL | 0 refills | Status: DC

## 2018-03-04 MED ORDER — TESTOSTERONE CYPIONATE 200 MG/ML IM SOLN
200.0000 mg | Freq: Once | INTRAMUSCULAR | Status: AC
Start: 2018-03-04 — End: 2018-03-04
  Administered 2018-03-04: 200 mg via INTRAMUSCULAR

## 2018-03-04 MED ORDER — AMPHETAMINE-DEXTROAMPHET ER 30 MG PO CP24: 30.0000 mg | ORAL_CAPSULE | Freq: Every day | ORAL | 0 refills | Status: DC

## 2018-03-04 NOTE — Progress Notes (Signed)
   Subjective:    Patient ID: Brandon Baker, male    DOB: [birth year], 37 y.o.   MRN: [id number]  HPI  Pt is a 37 yo male with OSA, hypogonadism, ADD, elevated ferritin who presents to the clinic for medication refill.   Pt is not using CPAP.   Adderall is doing really good. He does feel like he is a little more easily distracted and not lasting quite as long as it did at first. He is working full time and going to school part time. He is sleeping well at night. He denies any increase in anxiety.   Not yet made appt with hematology for elevated ferritin.   Pt does have decreased testosterone with last lab 162. He was using gel but just hard to remember and does not feel like it is improving symptoms very well. He would like to consider shots.   .. Active Ambulatory Problems    Diagnosis Date Noted  . Tobacco abuse 08/07/2012  . Chigger bite 11/24/2014  . Low back strain 08/01/2015  . Muscle spasm of back 08/01/2015  . Midline low back pain without sciatica 08/07/2015  . OSA (obstructive sleep apnea) 03/25/2016  . Situational depression 06/04/2017  . Adjustment insomnia 06/04/2017  . Morbid obesity (HCC) 06/04/2017  . Elevated ferritin 06/06/2017  . Elevated fasting glucose 06/06/2017  . Vitamin D deficiency 06/06/2017  . No energy 06/06/2017  . Decreased testosterone level 06/17/2017  . IFG (impaired fasting glucose) 06/17/2017  . Hypogonadism in male 10/31/2017  . Attention deficit disorder (ADD) without hyperactivity 10/31/2017   Resolved Ambulatory Problems    Diagnosis Date Noted  . No Resolved Ambulatory Problems   No Additional Past Medical History      Review of Systems  All other systems reviewed and are negative.      Objective:   Physical Exam  Constitutional: He is oriented to person, place, and time. He appears well-developed and well-nourished.  HENT:  Head: Normocephalic and atraumatic.  Cardiovascular: Normal rate and regular rhythm.   Pulmonary/Chest: Effort normal.  Neurological: He is alert and oriented to person, place, and time.  Psychiatric: He has a normal mood and affect. His behavior is normal.          Assessment & Plan:  Brandon Baker KitchenMarland KitchenBarrett was seen today for follow-up.  Diagnoses and all orders for this visit:  Attention deficit disorder (ADD) without hyperactivity -     amphetamine-dextroamphetamine (ADDERALL XR) 30 MG 24 hr capsule; Take 1 capsule (30 mg total) by mouth daily. -     amphetamine-dextroamphetamine (ADDERALL XR) 30 MG 24 hr capsule; Take 1 capsule (30 mg total) by mouth every morning. -     amphetamine-dextroamphetamine (ADDERALL XR) 30 MG 24 hr capsule; Take 1 capsule (30 mg total) by mouth every morning.  Hypogonadism in male -     testosterone cypionate (DEPOTESTOSTERONE CYPIONATE) injection 200 mg  Morbid obesity (HCC)  Elevated ferritin  OSA (obstructive sleep apnea)   Increased adderall. Follow up in 3 months.   Started testosterone shots. Repeat every 2 weeks.  Follow up labs in 1 month. Discussed side effects.   Printed off previous referral for elevated ferritin. Please make appt for evaluation.   Where CPAP could help energy level!

## 2018-03-04 NOTE — Patient Instructions (Signed)
Use CPAP. 

## 2018-03-05 ENCOUNTER — Encounter: Payer: Self-pay | Admitting: Physician Assistant

## 2018-03-06 ENCOUNTER — Encounter: Payer: Self-pay | Admitting: Physician Assistant

## 2018-03-06 ENCOUNTER — Other Ambulatory Visit: Payer: Self-pay | Admitting: Physician Assistant

## 2018-03-06 MED ORDER — FLUCONAZOLE 150 MG PO TABS: ORAL_TABLET | ORAL | 0 refills | Status: DC

## 2018-03-18 ENCOUNTER — Telehealth: Payer: Self-pay

## 2018-03-18 ENCOUNTER — Ambulatory Visit (INDEPENDENT_AMBULATORY_CARE_PROVIDER_SITE_OTHER): Payer: Federal, State, Local not specified - PPO | Admitting: Physician Assistant

## 2018-03-18 VITALS — BP 144/84 | HR 78 | Wt 269.0 lb

## 2018-03-18 DIAGNOSIS — E291 Testicular hypofunction: Secondary | ICD-10-CM | POA: Diagnosis not present

## 2018-03-18 DIAGNOSIS — R030 Elevated blood-pressure reading, without diagnosis of hypertension: Secondary | ICD-10-CM

## 2018-03-18 MED ORDER — TESTOSTERONE CYPIONATE 200 MG/ML IM SOLN
200.0000 mg | Freq: Once | INTRAMUSCULAR | Status: AC
  Administered 2018-03-18: 200 mg via INTRAMUSCULAR

## 2018-03-18 NOTE — Progress Notes (Signed)
   Subjective:    Patient ID: Brandon Baker, male    DOB: 04/15/1981, 37 y.o.   MRN: [id number]  HPI  Brandon QuanScott Lewing is here for a testosterone injection. Denies chest pain, shortness of breath, headaches or mood changes.   Review of Systems     Objective:   Physical Exam        Assessment & Plan:  Patient tolerated injection well without complications. Patient advised to schedule next injection 14 days from today. Need labs 7 days after next injection.   BP elevated. Will check in the next 2weeks for next injection. If continues to be elevated need to discuss medication. Jade Breeback PA-C.

## 2018-03-18 NOTE — Telephone Encounter (Signed)
Brandon Baker would like to start giving himself the testosterone injections at home. Please advise.

## 2018-03-18 NOTE — Telephone Encounter (Signed)
Come in for one more teach injection and BP recheck then ok to do so.

## 2018-03-20 NOTE — Telephone Encounter (Signed)
Left message for a return call

## 2018-04-06 ENCOUNTER — Telehealth: Payer: Self-pay | Admitting: Physician Assistant

## 2018-04-06 ENCOUNTER — Encounter: Payer: Self-pay | Admitting: Physician Assistant

## 2018-04-06 NOTE — Telephone Encounter (Signed)
Received fax from [employer] [facility] that Adderall has been approved from 03/07/2018 through 04/06/2019. Form sent to scan. Spoke with Tobi BastosAnna at [employer] and she is aware.

## 2018-04-06 NOTE — Telephone Encounter (Signed)
Received fax from [employer] that Adderall requires a PA. Information has been sent to the insurance company. Awaiting determination.   

## 2018-05-09 DIAGNOSIS — R05 Cough: Secondary | ICD-10-CM | POA: Diagnosis not present

## 2018-05-09 DIAGNOSIS — R0981 Nasal congestion: Secondary | ICD-10-CM | POA: Diagnosis not present

## 2018-05-09 DIAGNOSIS — F17210 Nicotine dependence, cigarettes, uncomplicated: Secondary | ICD-10-CM | POA: Diagnosis not present

## 2018-05-09 DIAGNOSIS — J069 Acute upper respiratory infection, unspecified: Secondary | ICD-10-CM | POA: Diagnosis not present

## 2018-05-09 DIAGNOSIS — J029 Acute pharyngitis, unspecified: Secondary | ICD-10-CM | POA: Diagnosis not present

## 2018-05-09 DIAGNOSIS — J3489 Other specified disorders of nose and nasal sinuses: Secondary | ICD-10-CM | POA: Diagnosis not present

## 2018-05-09 DIAGNOSIS — R0989 Other specified symptoms and signs involving the circulatory and respiratory systems: Secondary | ICD-10-CM | POA: Diagnosis not present

## 2018-05-09 DIAGNOSIS — Z888 Allergy status to other drugs, medicaments and biological substances status: Secondary | ICD-10-CM | POA: Diagnosis not present

## 2018-05-14 ENCOUNTER — Encounter: Payer: Self-pay | Admitting: Physician Assistant

## 2018-05-14 DIAGNOSIS — Z113 Encounter for screening for infections with a predominantly sexual mode of transmission: Secondary | ICD-10-CM

## 2018-05-19 LAB — HIV ANTIBODY (ROUTINE TESTING W REFLEX): HIV: NONREACTIVE

## 2018-05-19 LAB — RPR: RPR: NONREACTIVE

## 2018-06-25 ENCOUNTER — Ambulatory Visit (INDEPENDENT_AMBULATORY_CARE_PROVIDER_SITE_OTHER): Payer: 59

## 2018-06-25 ENCOUNTER — Encounter: Payer: Self-pay | Admitting: Family Medicine

## 2018-06-25 ENCOUNTER — Ambulatory Visit (INDEPENDENT_AMBULATORY_CARE_PROVIDER_SITE_OTHER): Payer: 59 | Admitting: Family Medicine

## 2018-06-25 VITALS — BP 117/79 | HR 121 | Temp 100.5°F | Ht 70.0 in | Wt 270.0 lb

## 2018-06-25 DIAGNOSIS — R05 Cough: Secondary | ICD-10-CM

## 2018-06-25 DIAGNOSIS — R6889 Other general symptoms and signs: Secondary | ICD-10-CM | POA: Diagnosis not present

## 2018-06-25 DIAGNOSIS — R059 Cough, unspecified: Secondary | ICD-10-CM

## 2018-06-25 DIAGNOSIS — J101 Influenza due to other identified influenza virus with other respiratory manifestations: Secondary | ICD-10-CM

## 2018-06-25 LAB — POCT INFLUENZA A/B
INFLUENZA A, POC: POSITIVE — AB
INFLUENZA B, POC: NEGATIVE

## 2018-06-25 MED ORDER — HYDROCODONE-HOMATROPINE 5-1.5 MG/5ML PO SYRP: 5.0000 mL | ORAL_SOLUTION | Freq: Three times a day (TID) | ORAL | 0 refills | Status: DC | PRN

## 2018-06-25 MED ORDER — OSELTAMIVIR PHOSPHATE 75 MG PO CAPS: 75.0000 mg | ORAL_CAPSULE | Freq: Two times a day (BID) | ORAL | 0 refills | Status: DC

## 2018-06-25 NOTE — Progress Notes (Signed)
Brandon Baker is a 38 y.o. male who presents to [facility]: [facility] today for fevers chills body aches headache cough congestion.  Gwendolyn has had sinus drainage on and off for the past month or 2.  He is tried some Sudafed in the past which helps a little.  However yesterday he became more ill with new fevers chills body aches and headache.  Additionally he developed a worsening cough.  He tried some ibuprofen which helped a little.  He is not tried any medications this morning.  He denies chest pain palpitations or significant shortness of breath.  He did not receive a flu vaccine this year.   ROS as above:  Exam:  BP 117/79   Pulse (!) 121   Temp (!) 100.5 F (38.1 C) (Oral)   Ht 5\' 10"  (1.778 m)   Wt 270 lb (122.5 kg)   SpO2 96%   BMI 38.74 kg/m  Wt Readings from Last 5 Encounters:  06/25/18 270 lb (122.5 kg)  03/18/18 269 lb (122 kg)  03/04/18 265 lb (120.2 kg)  12/08/17 267 lb (121.1 kg)  10/31/17 273 lb (123.8 kg)    Gen: Well NAD nontoxic appearing HEENT: EOMI,  MMM or nasal discharge inflamed nasal turbinates bilaterally.  Normal tympanic membranes with slightly erythematous ear canals bilaterally without others skin changes in ear canal.  Posterior pharynx with mild cobblestoning and mild erythema without exudate.  Mild cervical lymphadenopathy present bilaterally as well. Lungs: Normal work of breathing.  Coarse breath sounds present bilaterally Heart: Tachycardia but regular rhythm no MRG.  Heart rate 100 bpm per my check Abd: NABS, Soft. Nondistended, Nontender Exts: Brisk capillary refill, warm and well perfused.   Lab and Radiology Results Results for orders placed or performed in visit on 06/25/18 (from the past 72 hour(s))  POCT Influenza A/B     Status: Abnormal   Collection Time: 06/25/18  8:21 AM  Result Value Ref Range   Influenza A, POC  Positive (A) Negative   Influenza B, POC Negative Negative   No results found.  2 view chest x-ray images personally independently reviewed Bronchitic changes with no acute infiltrate. Await formal radiology review   Assessment and Plan: 38 y.o. male with new onset fevers body aches and headache.  Patient additionally has mild tachycardia.  Influenza  test positive for flu A.  To treat with Tamiflu and Hycodan for cough suppression.  Continue Tylenol or ibuprofen.  Recheck if not improving or if worsening.  Return as needed.  Recommend flu vaccine next year. PDMP reviewed during this encounter. Orders Placed This Encounter  Procedures  . DG Chest 2 View    Order Specific Question:   Reason for exam:    Answer:   Cough, assess intra-thoracic pathology    Order Specific Question:   Preferred imaging location?    Answer:   [facility]  . POCT Influenza A/B   Meds ordered this encounter  Medications  . oseltamivir (TAMIFLU) 75 MG capsule    Sig: Take 1 capsule (75 mg total) by mouth 2 (two) times daily.    Dispense:  10 capsule    Refill:  0  . HYDROcodone-homatropine (HYCODAN) 5-1.5 MG/5ML syrup    Sig: Take 5 mLs by mouth every 8 (eight) hours as needed for cough.    Dispense:  120 mL    Refill:  0     Historical information moved to improve visibility of  documentation.  No past medical history on file. No past surgical history on file. Social History   Tobacco Use  . Smoking status: Former Smoker    Packs/day: 1.00    Types: Cigarettes, E-cigarettes    Last attempt to quit: 07/05/2014    Years since quitting: 3.9  . Smokeless tobacco: Never Used  Substance Use Topics  . Alcohol use: Yes    Alcohol/week: 0.0 standard drinks   family history includes Alcohol abuse in his father and mother; Diabetes in his maternal grandfather and maternal grandmother; Heart attack in his maternal grandfather and maternal uncle; Hyperlipidemia in his maternal grandfather and  maternal grandmother; Hypertension in his maternal grandfather, maternal grandmother, and maternal uncle.  Medications: Current Outpatient Medications  Medication Sig Dispense Refill  . amphetamine-dextroamphetamine (ADDERALL XR) 30 MG 24 hr capsule Take 1 capsule (30 mg total) by mouth daily. 30 capsule 0  . amphetamine-dextroamphetamine (ADDERALL XR) 30 MG 24 hr capsule Take 1 capsule (30 mg total) by mouth every morning. 30 capsule 0  . amphetamine-dextroamphetamine (ADDERALL XR) 30 MG 24 hr capsule Take 1 capsule (30 mg total) by mouth every morning. 30 capsule 0  . ibuprofen (ADVIL,MOTRIN) 600 MG tablet Take 1 tablet (600 mg total) by mouth every 6 (six) hours as needed. 30 tablet 0  . HYDROcodone-homatropine (HYCODAN) 5-1.5 MG/5ML syrup Take 5 mLs by mouth every 8 (eight) hours as needed for cough. 120 mL 0  . oseltamivir (TAMIFLU) 75 MG capsule Take 1 capsule (75 mg total) by mouth 2 (two) times daily. 10 capsule 0   No current facility-administered medications for this visit.    No Known Allergies   Discussed warning signs or symptoms. Please see discharge instructions. Patient expresses understanding.

## 2018-06-25 NOTE — Patient Instructions (Signed)
Thank you for coming in today. Take tamiflu starting now.  User the cough medicine as needed.  Continue tylenol and ibuprofen.  Recheck as needed.  Call or go to the emergency room if you get worse, have trouble breathing, have chest pains, or palpitations.    Influenza, Adult Influenza, more commonly known as "the flu," is a viral infection that mainly affects the respiratory tract. The respiratory tract includes organs that help you breathe, such as the lungs, nose, and throat. The flu causes many symptoms similar to the common cold along with high fever and body aches. The flu spreads easily from person to person (is contagious). Getting a flu shot (influenza vaccination) every year is the best way to prevent the flu. What are the causes? This condition is caused by the influenza virus. You can get the virus by:  Breathing in droplets that are in the air from an infected person's cough or sneeze.  Touching something that has been exposed to the virus (has been contaminated) and then touching your mouth, nose, or eyes. What increases the risk? The following factors may make you more likely to get the flu:  Not washing or sanitizing your hands often.  Having close contact with many people during cold and flu season.  Touching your mouth, eyes, or nose without first washing or sanitizing your hands.  Not getting a yearly (annual) flu shot. You may have a higher risk for the flu, including serious problems such as a lung infection (pneumonia), if you:  Are older than 65.  Are pregnant.  Have a weakened disease-fighting system (immune system). You may have a weakened immune system if you: ? Have HIV or AIDS. ? Are undergoing chemotherapy. ? Are taking medicines that reduce (suppress) the activity of your immune system.  Have a long-term (chronic) illness, such as heart disease, kidney disease, diabetes, or lung disease.  Have a liver disorder.  Are severely overweight  (morbidly obese).  Have anemia. This is a condition that affects your red blood cells.  Have asthma. What are the signs or symptoms? Symptoms of this condition usually begin suddenly and last 4-14 days. They may include:  Fever and chills.  Headaches, body aches, or muscle aches.  Sore throat.  Cough.  Runny or stuffy (congested) nose.  Chest discomfort.  Poor appetite.  Weakness or fatigue.  Dizziness.  Nausea or vomiting. How is this diagnosed? This condition may be diagnosed based on:  Your symptoms and medical history.  A physical exam.  Swabbing your nose or throat and testing the fluid for the influenza virus. How is this treated? If the flu is diagnosed early, you can be treated with medicine that can help reduce how severe the illness is and how long it lasts (antiviral medicine). This may be given by mouth (orally) or through an IV. Taking care of yourself at home can help relieve symptoms. Your health care provider may recommend:  Taking over-the-counter medicines.  Drinking plenty of fluids. In many cases, the flu goes away on its own. If you have severe symptoms or complications, you may be treated in a hospital. Follow these instructions at home: Activity  Rest as needed and get plenty of sleep.  Stay home from work or school as told by your health care provider. Unless you are visiting your health care provider, avoid leaving home until your fever has been gone for 24 hours without taking medicine. Eating and drinking  Take an oral rehydration solution (ORS). This is  a drink that is sold at pharmacies and retail stores.  Drink enough fluid to keep your urine pale yellow.  Drink clear fluids in small amounts as you are able. Clear fluids include water, ice chips, diluted fruit juice, and low-calorie sports drinks.  Eat bland, easy-to-digest foods in small amounts as you are able. These foods include bananas, applesauce, rice, lean meats, toast,  and crackers.  Avoid drinking fluids that contain a lot of sugar or caffeine, such as energy drinks, regular sports drinks, and soda.  Avoid alcohol.  Avoid spicy or fatty foods. General instructions      Take over-the-counter and prescription medicines only as told by your health care provider.  Use a cool mist humidifier to add humidity to the air in your home. This can make it easier to breathe.  Cover your mouth and nose when you cough or sneeze.  Wash your hands with soap and water often, especially after you cough or sneeze. If soap and water are not available, use alcohol-based hand sanitizer.  Keep all follow-up visits as told by your health care provider. This is important. How is this prevented?   Get an annual flu shot. You may get the flu shot in late summer, fall, or winter. Ask your health care provider when you should get your flu shot.  Avoid contact with people who are sick during cold and flu season. This is generally fall and winter. Contact a health care provider if:  You develop new symptoms.  You have: ? Chest pain. ? Diarrhea. ? A fever.  Your cough gets worse.  You produce more mucus.  You feel nauseous or you vomit. Get help right away if:  You develop shortness of breath or difficulty breathing.  Your skin or nails turn a bluish color.  You have severe pain or stiffness in your neck.  You develop a sudden headache or sudden pain in your face or ear.  You cannot eat or drink without vomiting. Summary  Influenza, more commonly known as "the flu," is a viral infection that primarily affects your respiratory tract.  Symptoms of the flu usually begin suddenly and last 4-14 days.  Getting an annual flu shot is the best way to prevent getting the flu.  Stay home from work or school as told by your health care provider. Unless you are visiting your health care provider, avoid leaving home until your fever has been gone for 24 hours without  taking medicine.  Keep all follow-up visits as told by your health care provider. This is important. This information is not intended to replace advice given to you by your health care provider. Make sure you discuss any questions you have with your health care provider. Document Released: 04/19/2000 Document Revised: 10/08/2017 Document Reviewed: 10/08/2017 Elsevier Interactive Patient Education  2019 [employer].

## 2018-06-26 ENCOUNTER — Ambulatory Visit: Payer: 59 | Admitting: Physician Assistant

## 2018-07-16 ENCOUNTER — Encounter: Payer: Self-pay | Admitting: Physician Assistant

## 2018-07-16 DIAGNOSIS — F988 Other specified behavioral and emotional disorders with onset usually occurring in childhood and adolescence: Secondary | ICD-10-CM

## 2018-07-17 MED ORDER — AMPHETAMINE-DEXTROAMPHET ER 30 MG PO CP24: 30.0000 mg | ORAL_CAPSULE | Freq: Every day | ORAL | 0 refills | Status: DC

## 2018-07-20 ENCOUNTER — Encounter: Payer: Self-pay | Admitting: Physician Assistant

## 2018-07-20 ENCOUNTER — Other Ambulatory Visit: Payer: Self-pay

## 2018-07-20 ENCOUNTER — Ambulatory Visit: Payer: 59 | Admitting: Physician Assistant

## 2018-07-20 VITALS — BP 128/87 | HR 98 | Resp 16 | Ht 70.0 in | Wt 271.9 lb

## 2018-07-20 DIAGNOSIS — E6609 Other obesity due to excess calories: Secondary | ICD-10-CM

## 2018-07-20 DIAGNOSIS — R7989 Other specified abnormal findings of blood chemistry: Secondary | ICD-10-CM

## 2018-07-20 DIAGNOSIS — E291 Testicular hypofunction: Secondary | ICD-10-CM | POA: Diagnosis not present

## 2018-07-20 DIAGNOSIS — F988 Other specified behavioral and emotional disorders with onset usually occurring in childhood and adolescence: Secondary | ICD-10-CM

## 2018-07-20 DIAGNOSIS — Z6839 Body mass index (BMI) 39.0-39.9, adult: Secondary | ICD-10-CM

## 2018-07-20 MED ORDER — AMPHETAMINE-DEXTROAMPHET ER 30 MG PO CP24: 30.0000 mg | ORAL_CAPSULE | Freq: Every day | ORAL | 0 refills | Status: DC

## 2018-07-20 MED ORDER — AMPHETAMINE-DEXTROAMPHET ER 30 MG PO CP24: 30.0000 mg | ORAL_CAPSULE | ORAL | 0 refills | Status: DC

## 2018-07-20 MED ORDER — TESTOSTERONE CYPIONATE 200 MG/ML IM SOLN
200.0000 mg | Freq: Once | INTRAMUSCULAR | Status: AC
  Administered 2018-07-20: 200 mg via INTRAMUSCULAR

## 2018-07-20 NOTE — Patient Instructions (Signed)
Hemochromatosis  Hemochromatosis is a condition in which the body stores too much iron. This is also called iron storage disease or iron overload disorder. The extra iron builds up in your joints, heart, liver, pancreas, and other organs, where it can cause damage.  There are two forms of this condition; they include:   Hereditary hemochromatosis. Defects (mutations) on certain genes can cause symptoms to develop. With this type of the condition, the body absorbs more iron than it needs from the foods you eat.   Secondary hemochromatosis. With this type of the condition, iron builds up in the body due to other reasons, such as from liver disease or blood transfusions.  What are the causes?  This condition may be caused by:   Abnormal genes passed down from both parents (inherited).   Receiving blood from a donor (blood transfusion).   Problems with the way the body uses iron in the bone marrow (ineffective erythropoiesis).   Having chronic liver disease, such as hepatitis or liver cancer.  What increases the risk?  You are more likely to develop this condition if you:   Inherit certain abnormal gene mutations from both parents.   Are white (Caucasian).   Have severe or long-term (chronic) anemia.  What are the signs or symptoms?  Signs and symptoms can start at any age, but they usually start in middle age. They may include:   Fatigue.   Weakness.   Joint pain and stiffness.   Abdominal pain.   Weight loss.   Skin turning a gray or bronze color.   Loss of interest in sex.   Loss of menstrual periods, in women.   Loss of body hair.   Shortness of breath.  As hemochromatosis gets worse, it may damage the liver, heart, or pancreas. This may lead to complications such as:   Diabetes.   Liver cancer.   Abnormal heart rhythms.   Heart failure.  How is this diagnosed?  This condition may be diagnosed based on:   Your symptoms and medical history.   A physical exam.   Blood tests.   Genetic  testing.   Removal and testing of a sample of liver tissue (liver biopsy).  How is this treated?  This condition is most often treated with:   Therapeutic phlebotomy. In this procedure, some of your blood is removed periodically in order to reduce the amount of iron in your body. Over time, your body will naturally replace the blood cells that you lose during phlebotomy. At the start of treatment, you may have a unit of blood removed once or twice a week. You will have blood tests during this time to determine when your iron levels return to normal. Once your iron levels are normal, you may only need to have a phlebotomy every few months.   Lifestyle changes. This may include not drinking alcohol and limiting certain items in your diet.   Medicines to remove excess iron (chelation therapy).   Medicines to help treat any related conditions.   Genetic counseling. If you are found to have a gene mutation, other family members may need to be tested for hereditary hemochromatosis.  Follow these instructions at home:  Alcohol use     If you have liver damage, do not drink alcohol.   If you do not have liver damage:  ? Do not drink alcohol if:   Your health care provider tells you not to drink.   You are pregnant, may be pregnant, or are   planning to become pregnant.  ? If you drink alcohol, limit how much you have:   0-1 drink a day for women.   0-2 drinks a day for men.  ? Be aware of how much alcohol is in your drink. In the [place] one drink equals one typical bottle of beer (12 oz), one-half glass of wine (5 oz), or one shot of hard liquor (1 oz).  General instructions   Stay active. Exercise for at least 30 minutes on most days of the week.   Take over-the-counter and prescription medicines only as told by your health care provider. This includes vitamins and supplements.   You may need to have frequent blood or urine testing to monitor your condition and to look for complications.   Follow any  instructions as directed by your health care provider regarding dietary restrictions.   Do not:  ? Take vitamins or supplements that contain iron.  ? Take vitamin C supplements. Vitamin C makes your body absorb more iron from foods.  ? Eat raw shellfish or raw fish. Hemochromatosis may increase your chance for infection from these foods.   Keep all follow-up visits as told by your health care provider. This is important.  Contact a health care provider if you have:   Fatigue.   Unusual weakness.   Shortness of breath.   Joint pain.   Abdominal pain.   Weight loss.  Get help right away if you have:   Chest pain.   Trouble breathing.  Summary   Hemochromatosis is a condition in which your body stores too much iron. This is also called iron storage disease or iron overload disorder.   The extra iron builds up in your joints, heart, liver, pancreas, and other organs, where it can cause damage.   Signs and symptoms can start at any age, but they usually start in middle age.   To treat this condition, you will need to have some of your blood removed periodically (therapeutic phlebotomy). Removing some of your blood also removes iron from your body.   You may need to have frequent blood or urine testing to monitor your condition and to look for complications.  This information is not intended to replace advice given to you by your health care provider. Make sure you discuss any questions you have with your health care provider.  Document Released: 04/19/2000 Document Revised: 04/30/2017 Document Reviewed: 04/30/2017  Elsevier Interactive Patient Education  2019 [employer]

## 2018-07-20 NOTE — Progress Notes (Signed)
   Subjective:    Patient ID: Erlene Quan, male    DOB: 10/27/1980, 38 y.o.   MRN: [id number]  HPI  Pt is a 38 yo male with ADD, elevated ferritin, male hypogonadism who presents to the clinic to get restarted on his medications.   His insurance changed and not on any medications. He has noticed a lot more fatigue. He felt much better on adderall and testosterone shots. He previously failed testosterone gel.   He admits it is more down lately. He is on low dose of zoloft. NO SI/HC.   He never had follow up on elevated ferritin.   .. Active Ambulatory Problems    Diagnosis Date Noted  . Tobacco abuse 08/07/2012  . OSA (obstructive sleep apnea) 03/25/2016  . Situational depression 06/04/2017  . Adjustment insomnia 06/04/2017  . Class 2 obesity due to excess calories without serious comorbidity with body mass index (BMI) of 39.0 to 39.9 in adult 06/04/2017  . Elevated ferritin 06/06/2017  . Vitamin D deficiency 06/06/2017  . IFG (impaired fasting glucose) 06/17/2017  . Male hypogonadism 10/31/2017  . Attention deficit disorder (ADD) without hyperactivity 10/31/2017   Resolved Ambulatory Problems    Diagnosis Date Noted  . Chigger bite 11/24/2014  . Low back strain 08/01/2015  . Muscle spasm of back 08/01/2015  . Midline low back pain without sciatica 08/07/2015  . Elevated fasting glucose 06/06/2017  . No energy 06/06/2017  . Decreased testosterone level 06/17/2017   No Additional Past Medical History          Review of Systems  All other systems reviewed and are negative.      Objective:   Physical Exam Vitals signs reviewed.  Constitutional:      Appearance: Normal appearance. He is obese.  HENT:     Head: Normocephalic.  Cardiovascular:     Rate and Rhythm: Normal rate and regular rhythm.     Pulses: Normal pulses.  Pulmonary:     Effort: Pulmonary effort is normal.     Breath sounds: Normal breath sounds.  Neurological:     General: No focal deficit  present.     Mental Status: He is alert and oriented to person, place, and time.  Psychiatric:        Mood and Affect: Mood normal.           Assessment & Plan:  Marland KitchenMarland KitchenLeeson was seen today for follow-up.  Diagnoses and all orders for this visit:  Attention deficit disorder (ADD) without hyperactivity -     amphetamine-dextroamphetamine (ADDERALL XR) 30 MG 24 hr capsule; Take 1 capsule (30 mg total) by mouth daily. -     amphetamine-dextroamphetamine (ADDERALL XR) 30 MG 24 hr capsule; Take 1 capsule (30 mg total) by mouth every morning. -     amphetamine-dextroamphetamine (ADDERALL XR) 30 MG 24 hr capsule; Take 1 capsule (30 mg total) by mouth every morning.  Elevated ferritin  Male hypogonadism -     testosterone cypionate (DEPOTESTOSTERONE CYPIONATE) injection 200 mg  Class 2 obesity due to excess calories without serious comorbidity with body mass index (BMI) of 39.0 to 39.9 in adult   Restart testosterone shot. After 3 shots need to get labs.   Pt never got treatment/evaluation for elevated ferritin. Will recheck with labs.   Refilled adderall for 3 months.   Follow up in office in 3 months.

## 2018-08-03 ENCOUNTER — Ambulatory Visit (INDEPENDENT_AMBULATORY_CARE_PROVIDER_SITE_OTHER): Payer: 59 | Admitting: Physician Assistant

## 2018-08-03 ENCOUNTER — Other Ambulatory Visit: Payer: Self-pay

## 2018-08-03 VITALS — BP 140/78 | HR 102 | Wt 267.0 lb

## 2018-08-03 DIAGNOSIS — E291 Testicular hypofunction: Secondary | ICD-10-CM | POA: Diagnosis not present

## 2018-08-03 MED ORDER — TESTOSTERONE CYPIONATE 200 MG/ML IM SOLN
200.0000 mg | Freq: Once | INTRAMUSCULAR | Status: AC
  Administered 2018-08-03: 200 mg via INTRAMUSCULAR

## 2018-08-03 NOTE — Progress Notes (Signed)
Established Patient Office Visit  Subjective:  Patient ID: Brandon Baker, male    DOB: 1981-01-26  Age: 38 y.o. MRN: [id number]  CC:  Chief Complaint  Patient presents with  . Hypogonadism    HPI Brandon Baker is here for a testosterone injection. Denies chest pain, shortness of breath, headaches or mood changes. He will receive education for the testosterone injection for future injections.   No past medical history on file.  No past surgical history on file.  Family History  Problem Relation Age of Onset  . Alcohol abuse Mother   . Alcohol abuse Father   . Heart attack Maternal Uncle   . Hypertension Maternal Uncle   . Diabetes Maternal Grandmother   . Hyperlipidemia Maternal Grandmother   . Hypertension Maternal Grandmother   . Heart attack Maternal Grandfather   . Diabetes Maternal Grandfather   . Hyperlipidemia Maternal Grandfather   . Hypertension Maternal Grandfather     Social History   Socioeconomic History  . Marital status: Legally Separated    Spouse name: Not on file  . Number of children: Not on file  . Years of education: Not on file  . Highest education level: Not on file  Occupational History  . Not on file  Social Needs  . Financial resource strain: Not on file  . Food insecurity:    Worry: Not on file    Inability: Not on file  . Transportation needs:    Medical: Not on file    Non-medical: Not on file  Tobacco Use  . Smoking status: Former Smoker    Packs/day: 1.00    Types: Cigarettes, E-cigarettes    Last attempt to quit: 07/05/2014    Years since quitting: 4.0  . Smokeless tobacco: Never Used  Substance and Sexual Activity  . Alcohol use: Yes    Alcohol/week: 0.0 standard drinks  . Drug use: Not Currently  . Sexual activity: Yes  Lifestyle  . Physical activity:    Days per week: Not on file    Minutes per session: Not on file  . Stress: Not on file  Relationships  . Social connections:    Talks on phone: Not on file    Gets  together: Not on file    Attends religious service: Not on file    Active member of club or organization: Not on file    Attends meetings of clubs or organizations: Not on file    Relationship status: Not on file  . Intimate partner violence:    Fear of current or ex partner: Not on file    Emotionally abused: Not on file    Physically abused: Not on file    Forced sexual activity: Not on file  Other Topics Concern  . Not on file  Social History Narrative  . Not on file    Outpatient Medications Prior to Visit  Medication Sig Dispense Refill  . [START ON 08/20/2018] amphetamine-dextroamphetamine (ADDERALL XR) 30 MG 24 hr capsule Take 1 capsule (30 mg total) by mouth daily. 30 capsule 0  . amphetamine-dextroamphetamine (ADDERALL XR) 30 MG 24 hr capsule Take 1 capsule (30 mg total) by mouth every morning. 30 capsule 0  . [START ON 09/19/2018] amphetamine-dextroamphetamine (ADDERALL XR) 30 MG 24 hr capsule Take 1 capsule (30 mg total) by mouth every morning. 30 capsule 0  . ibuprofen (ADVIL,MOTRIN) 600 MG tablet Take 1 tablet (600 mg total) by mouth every 6 (six) hours as needed. 30 tablet 0  No facility-administered medications prior to visit.     No Known Allergies  ROS Review of Systems    Objective:    Physical Exam  BP 140/78   Pulse (!) 102   Wt 267 lb (121.1 kg)   SpO2 98%   BMI 38.31 kg/m  Wt Readings from Last 3 Encounters:  08/03/18 267 lb (121.1 kg)  07/20/18 271 lb 14.4 oz (123.3 kg)  06/25/18 270 lb (122.5 kg)     There are no preventive care reminders to display for this patient.  There are no preventive care reminders to display for this patient.  Lab Results  Component Value Date   TSH 1.51 06/05/2017   Lab Results  Component Value Date   WBC 6.3 06/05/2017   HGB 16.7 06/05/2017   HCT 47.5 06/05/2017   MCV 85.7 06/05/2017   PLT 210 06/05/2017   Lab Results  Component Value Date   NA 140 06/05/2017   K 4.3 06/05/2017   CO2 31 06/05/2017    GLUCOSE 108 (H) 06/05/2017   BUN 12 06/05/2017   CREATININE 1.05 06/05/2017   BILITOT 0.4 06/05/2017   AST 28 06/05/2017   ALT 68 (H) 06/05/2017   PROT 7.0 06/05/2017   CALCIUM 9.6 06/05/2017   No results found for: CHOL No results found for: HDL No results found for: LDLCALC No results found for: TRIG No results found for: CHOLHDL Lab Results  Component Value Date   HGBA1C 5.4 06/13/2017      Assessment & Plan:  Hypogonadism - Patient tolerated injection well without complications.       Problem List Items Addressed This Visit      Unprioritized   Male hypogonadism - Primary   Relevant Medications   testosterone cypionate (DEPOTESTOSTERONE CYPIONATE) injection 200 mg (Completed)      Meds ordered this encounter  Medications  . testosterone cypionate (DEPOTESTOSTERONE CYPIONATE) injection 200 mg    Follow-up: Return in about 2 weeks (around 08/17/2018).    [name], PA-C

## 2018-08-18 ENCOUNTER — Encounter: Payer: Self-pay | Admitting: Physician Assistant

## 2018-08-18 DIAGNOSIS — E291 Testicular hypofunction: Secondary | ICD-10-CM

## 2018-08-19 ENCOUNTER — Encounter: Payer: Self-pay | Admitting: Physician Assistant

## 2018-08-19 NOTE — Telephone Encounter (Signed)
I believe he was suppose to come back to office in 2 weeks and learn the shot first?

## 2018-08-20 NOTE — Telephone Encounter (Signed)
I was never alerted to send. Ok to send testosterone q 2weeks to pharmacy. Would like to check labs after 3 shots.

## 2018-08-21 ENCOUNTER — Ambulatory Visit (INDEPENDENT_AMBULATORY_CARE_PROVIDER_SITE_OTHER): Payer: 59 | Admitting: Physician Assistant

## 2018-08-21 ENCOUNTER — Encounter: Payer: Self-pay | Admitting: Physician Assistant

## 2018-08-21 VITALS — Ht 70.0 in | Wt 271.0 lb

## 2018-08-21 DIAGNOSIS — F430 Acute stress reaction: Secondary | ICD-10-CM

## 2018-08-21 DIAGNOSIS — F419 Anxiety disorder, unspecified: Secondary | ICD-10-CM

## 2018-08-21 DIAGNOSIS — F320 Major depressive disorder, single episode, mild: Secondary | ICD-10-CM

## 2018-08-21 MED ORDER — "SYRINGE/NEEDLE (DISP) 18G X 1-1/2"" 3 ML MISC": 0 refills | Status: DC

## 2018-08-21 MED ORDER — FLUOXETINE HCL 10 MG PO TABS: 10.0000 mg | ORAL_TABLET | Freq: Every day | ORAL | 1 refills | Status: DC

## 2018-08-21 MED ORDER — "NEEDLE (DISP) 22G X 1-1/2"" MISC": 0 refills | Status: DC

## 2018-08-21 MED ORDER — CLONAZEPAM 0.5 MG PO TABS: 0.5000 mg | ORAL_TABLET | Freq: Two times a day (BID) | ORAL | 1 refills | Status: DC | PRN

## 2018-08-21 MED ORDER — TESTOSTERONE CYPIONATE 200 MG/ML IM SOLN: 200.0000 mg | INTRAMUSCULAR | 0 refills | Status: DC

## 2018-08-21 NOTE — Progress Notes (Signed)
Having increased anxiety and trouble sleeping. PHQ-9/GAD7 completed.

## 2018-08-21 NOTE — Progress Notes (Signed)
Patient ID: Brandon Baker, male   DOB: 1981/05/05, 38 y.o.   MRN: [id number] .Marland KitchenVirtual Visit via Video Note  I connected with Brandon Baker on 08/24/18 at  2:20 PM EDT by a video enabled telemedicine application and verified that I am speaking with the correct person using two identifiers.   I discussed the limitations of evaluation and management by telemedicine and the availability of in person appointments. The patient expressed understanding and agreed to proceed.  History of Present Illness: Pt is a 38 yo male with ADD and some situational depression who calls in to discuss worsening mood. There have been so many changes over the last month or so. During COVID crisis he is busier than ever at work all while homeschooling his son. His ex-wife also has another boyfriend and has introduced him to their son. Pt just feels a little overwhelmed. His anxiety is up but also feels down and depressed at the same time. No SI/HC. He would like something to help him get through this time.   .. Active Ambulatory Problems    Diagnosis Date Noted  . Tobacco abuse 08/07/2012  . OSA (obstructive sleep apnea) 03/25/2016  . Situational depression 06/04/2017  . Adjustment insomnia 06/04/2017  . Class 2 obesity due to excess calories without serious comorbidity with body mass index (BMI) of 39.0 to 39.9 in adult 06/04/2017  . Elevated ferritin 06/06/2017  . Vitamin D deficiency 06/06/2017  . IFG (impaired fasting glucose) 06/17/2017  . Male hypogonadism 10/31/2017  . Attention deficit disorder (ADD) without hyperactivity 10/31/2017  . Depression, major, single episode, mild (HCC) 08/24/2018  . Anxiety 08/24/2018  . Acute stress reaction 08/24/2018   Resolved Ambulatory Problems    Diagnosis Date Noted  . Chigger bite 11/24/2014  . Low back strain 08/01/2015  . Muscle spasm of back 08/01/2015  . Midline low back pain without sciatica 08/07/2015  . Elevated fasting glucose 06/06/2017  . No energy  06/06/2017  . Decreased testosterone level 06/17/2017   No Additional Past Medical History   Reviewed med, allergy, problem list.     Observations/Objective: No acute distress.  .. Depression screen [facility] 2/9 08/21/2018 09/30/2017 06/03/2017  Decreased Interest 3 1 3   Down, Depressed, Hopeless 3 1 2   PHQ - 2 Score 6 2 5   Altered sleeping 2 1 3   Tired, decreased energy 3 1 3   Change in appetite 0 1 3  Feeling bad or failure about yourself  0 1 0  Trouble concentrating 1 - 3  Moving slowly or fidgety/restless 3 0 2  Suicidal thoughts 0 0 0  PHQ-9 Score 15 6 19   Difficult doing work/chores Very difficult Somewhat difficult Very difficult   .Marland Kitchen GAD 7 : Generalized Anxiety Score 08/21/2018 09/30/2017 06/03/2017  Nervous, Anxious, on Edge 3 1 2   Control/stop worrying 2 1 1   Worry too much - different things 2 1 1   Trouble relaxing 3 1 2   Restless 3 1 3   Easily annoyed or irritable 3 1 3   Afraid - awful might happen 1 0 0  Total GAD 7 Score 17 6 12   Anxiety Difficulty Very difficult Somewhat difficult -     Assessment and Plan: Marland KitchenMarland KitchenAngeles was seen today for anxiety.  Diagnoses and all orders for this visit:  Acute stress reaction -     clonazePAM (KLONOPIN) 0.5 MG tablet; Take 1 tablet (0.5 mg total) by mouth 2 (two) times daily as needed for anxiety. -     FLUoxetine (PROZAC) 10 MG  tablet; Take 1 tablet (10 mg total) by mouth daily.  Depression, major, single episode, mild (HCC) -     FLUoxetine (PROZAC) 10 MG tablet; Take 1 tablet (10 mg total) by mouth daily.  Anxiety -     clonazePAM (KLONOPIN) 0.5 MG tablet; Take 1 tablet (0.5 mg total) by mouth 2 (two) times daily as needed for anxiety. -     FLUoxetine (PROZAC) 10 MG tablet; Take 1 tablet (10 mg total) by mouth daily.  started prozac and as needed klonapin. Discussed side effects. Discussed as needed usage of klonapin and dependence potential. prozac may take 4-6 weeks to full take effect. Encouraged counseling. Pt reports  he does not have time for that right now. Encouraged exercise and mediation. Try to find something he enjoys during this time so he has an outlet.   Follow up in 4-6 weeks.    Follow Up Instructions:    I discussed the assessment and treatment plan with the patient. The patient was provided an opportunity to ask questions and all were answered. The patient agreed with the plan and demonstrated an understanding of the instructions.   The patient was advised to call back or seek an in-person evaluation if the symptoms worsen or if the condition fails to improve as anticipated.  I provided 25 minutes of non-face-to-face time during this encounter.   Tandy GawJade Vinal Rosengrant, PA-C

## 2018-08-24 DIAGNOSIS — F430 Acute stress reaction: Secondary | ICD-10-CM | POA: Insufficient documentation

## 2018-08-24 DIAGNOSIS — F320 Major depressive disorder, single episode, mild: Secondary | ICD-10-CM | POA: Insufficient documentation

## 2018-08-24 DIAGNOSIS — F419 Anxiety disorder, unspecified: Secondary | ICD-10-CM | POA: Insufficient documentation

## 2018-09-03 ENCOUNTER — Telehealth: Payer: Self-pay | Admitting: Physician Assistant

## 2018-09-03 NOTE — Telephone Encounter (Signed)
He has low testosterone though so unsure why they will not pay. Are they wanting him to try gels first?

## 2018-09-03 NOTE — Telephone Encounter (Signed)
I placed a letter in your box for this patient since his testosterone was denied. I don't know if you have any other recommendations. I have put everything that was in the chart for the patient. Please advise.

## 2018-09-04 NOTE — Telephone Encounter (Signed)
Can you write an appeal letter for this patient and I will send this off with the insurance denial?  It has been 2 weeks since last injection. Do we need to offer him to come in since the pharmacy price is over $100? Please advise.

## 2018-09-08 NOTE — Telephone Encounter (Signed)
His low T levels are not registering with insurance. He was originally tested with 2 lows but then never started medication. Can we submit his low readings and report started on replacement because he never start medication and was going to test for normal range after.   Does this make sense?

## 2018-09-09 NOTE — Telephone Encounter (Signed)
Re-faxed to insurance and waiting on a response.

## 2018-09-11 NOTE — Telephone Encounter (Signed)
Received fax from [employer] that Testosterone was approved from 09/10/2018 through 03/13/2019. Pharmacy notified and forms sent to scan.

## 2018-09-16 ENCOUNTER — Encounter: Payer: Self-pay | Admitting: Physician Assistant

## 2018-11-24 ENCOUNTER — Encounter: Payer: Self-pay | Admitting: Physician Assistant

## 2019-01-15 ENCOUNTER — Other Ambulatory Visit: Payer: Self-pay | Admitting: Physician Assistant

## 2019-01-15 ENCOUNTER — Encounter: Payer: Self-pay | Admitting: Physician Assistant

## 2019-01-15 DIAGNOSIS — F988 Other specified behavioral and emotional disorders with onset usually occurring in childhood and adolescence: Secondary | ICD-10-CM

## 2019-01-15 MED ORDER — PREDNISONE 50 MG PO TABS: 50.0000 mg | ORAL_TABLET | Freq: Every day | ORAL | 0 refills | Status: DC

## 2019-01-15 MED ORDER — AMPHETAMINE-DEXTROAMPHET ER 30 MG PO CP24: 30.0000 mg | ORAL_CAPSULE | Freq: Every day | ORAL | 0 refills | Status: DC

## 2019-02-01 IMAGING — DX DG FOOT COMPLETE 3+V*R*
3 series · 3 of 3 positions shown · non-contrast
Comparison: None.

CLINICAL DATA: Right foot pain after jumping injury 2 days ago.

EXAM:
RIGHT FOOT COMPLETE - 3+ VIEW

[foot ap]
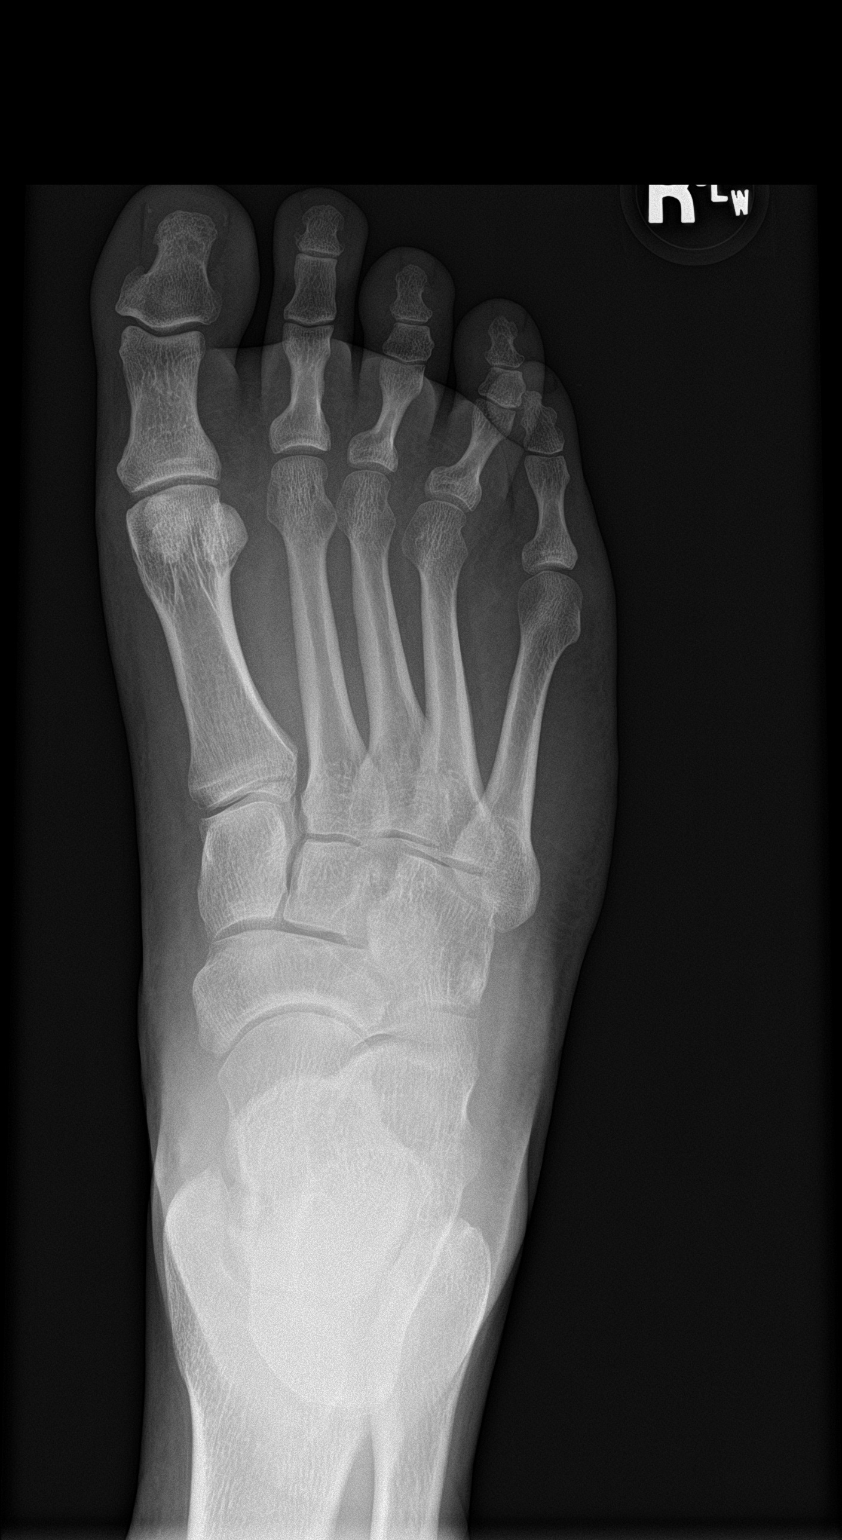

[foot obl]
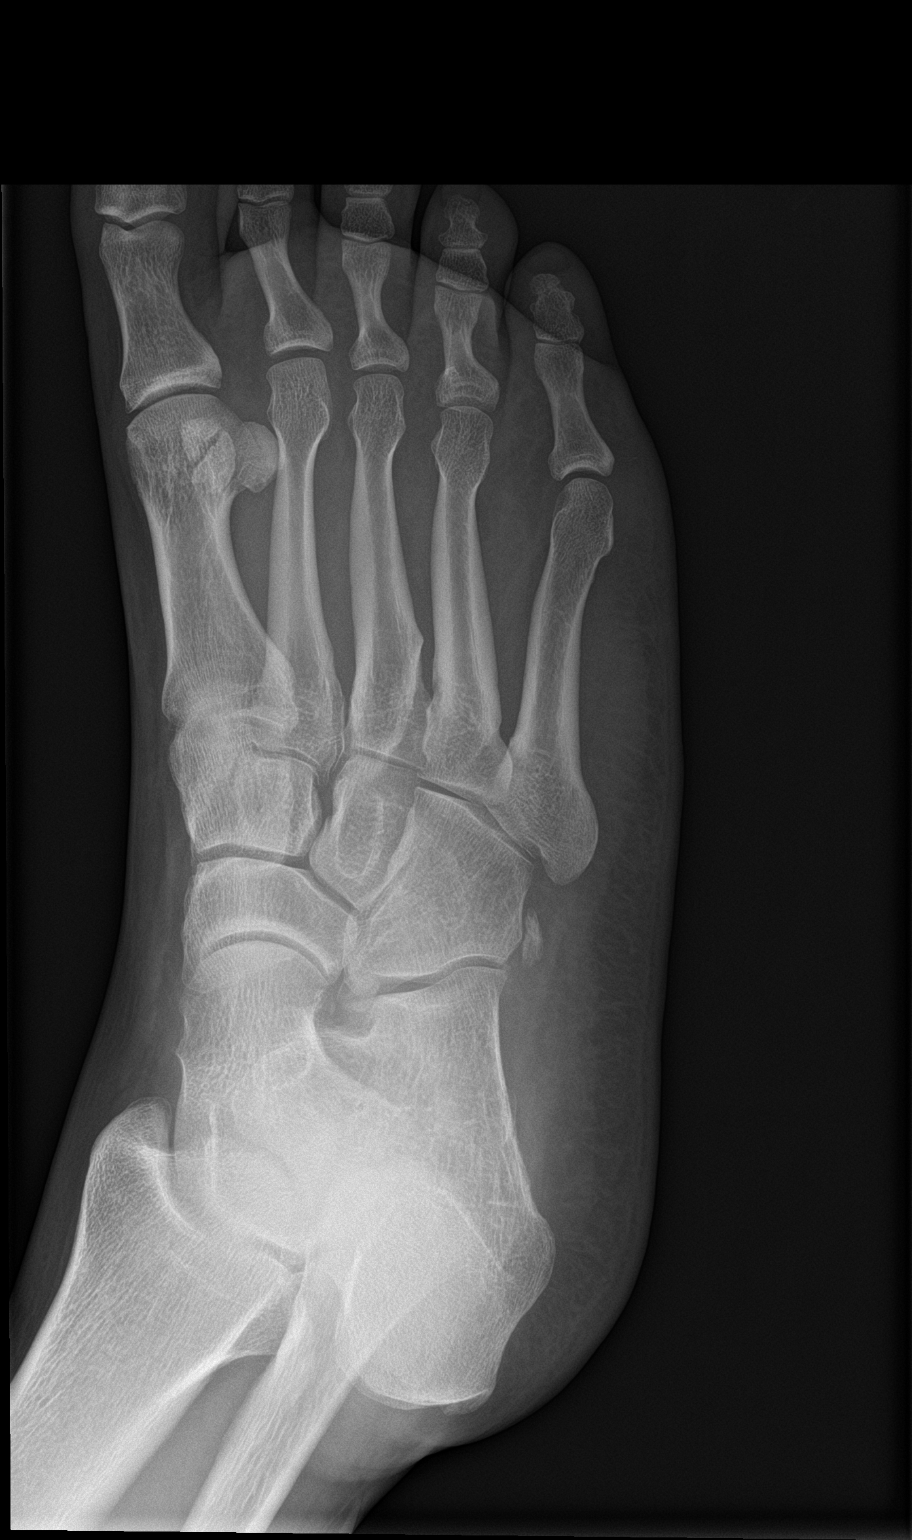

[foot lat]
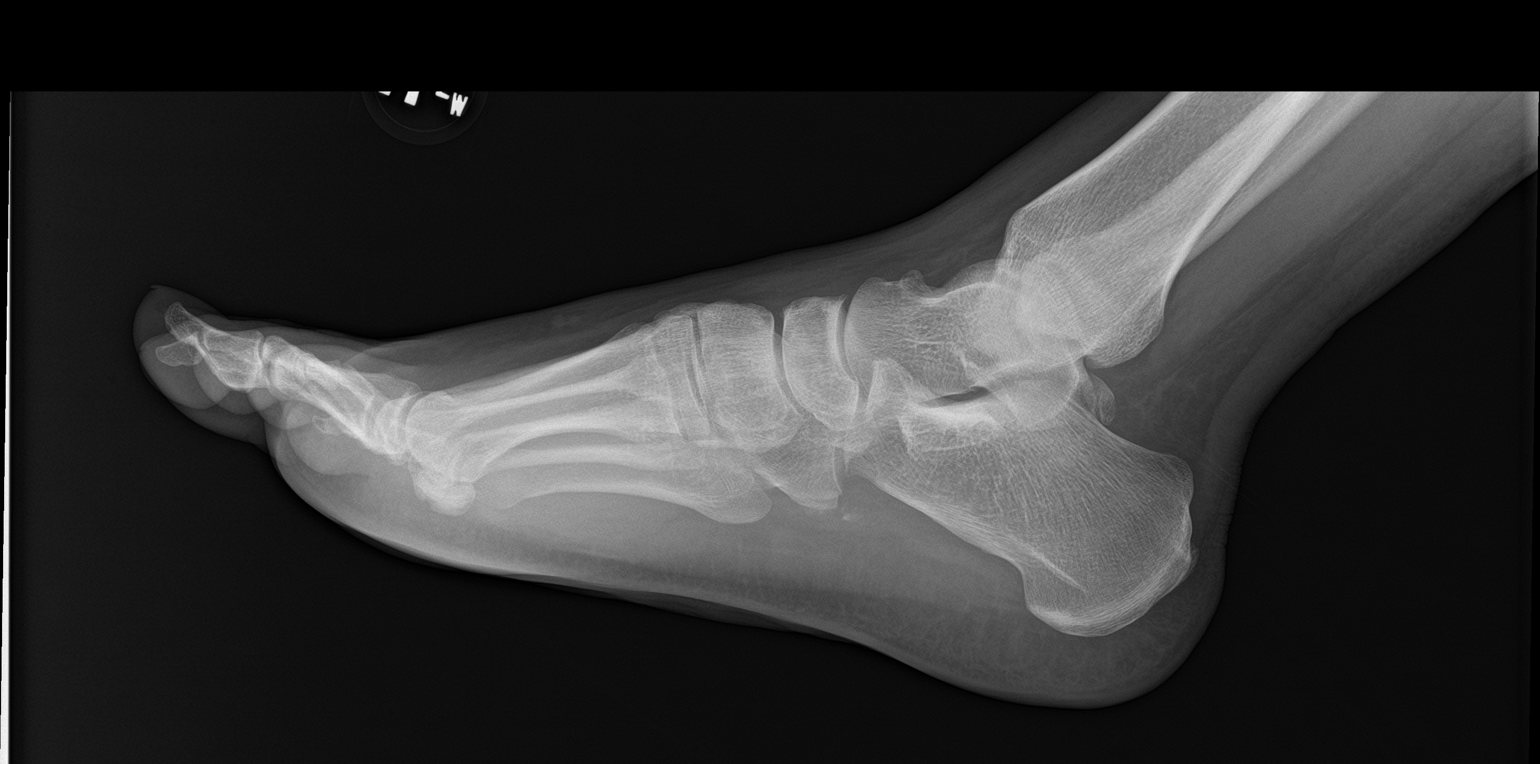

[3 of 3 positions shown; findings below may reference images not displayed]

FINDINGS: There is no evidence of fracture or dislocation. There is no
evidence of arthropathy or other focal bone abnormality. Soft
tissues are unremarkable.
IMPRESSION: Normal right foot.

## 2019-02-23 ENCOUNTER — Encounter: Payer: Self-pay | Admitting: Physician Assistant

## 2019-03-01 ENCOUNTER — Ambulatory Visit (INDEPENDENT_AMBULATORY_CARE_PROVIDER_SITE_OTHER): Payer: 59 | Admitting: Physician Assistant

## 2019-03-01 ENCOUNTER — Other Ambulatory Visit: Payer: Self-pay

## 2019-03-01 VITALS — BP 142/83 | HR 86 | Ht 70.0 in | Wt 277.0 lb

## 2019-03-01 DIAGNOSIS — Z131 Encounter for screening for diabetes mellitus: Secondary | ICD-10-CM | POA: Diagnosis not present

## 2019-03-01 DIAGNOSIS — F988 Other specified behavioral and emotional disorders with onset usually occurring in childhood and adolescence: Secondary | ICD-10-CM

## 2019-03-01 DIAGNOSIS — R030 Elevated blood-pressure reading, without diagnosis of hypertension: Secondary | ICD-10-CM

## 2019-03-01 DIAGNOSIS — Z13220 Encounter for screening for lipoid disorders: Secondary | ICD-10-CM

## 2019-03-01 DIAGNOSIS — N529 Male erectile dysfunction, unspecified: Secondary | ICD-10-CM

## 2019-03-01 DIAGNOSIS — F320 Major depressive disorder, single episode, mild: Secondary | ICD-10-CM

## 2019-03-01 MED ORDER — AMPHETAMINE-DEXTROAMPHET ER 30 MG PO CP24: 30.0000 mg | ORAL_CAPSULE | Freq: Every day | ORAL | 0 refills | Status: DC

## 2019-03-01 MED ORDER — AMPHETAMINE-DEXTROAMPHET ER 30 MG PO CP24: 30.0000 mg | ORAL_CAPSULE | ORAL | 0 refills | Status: DC

## 2019-03-01 MED ORDER — SILDENAFIL CITRATE 100 MG PO TABS: 50.0000 mg | ORAL_TABLET | Freq: Every day | ORAL | 11 refills | Status: DC | PRN

## 2019-03-01 NOTE — Patient Instructions (Signed)
Erectile Dysfunction Erectile dysfunction (ED) is the inability to get or keep an erection in order to have sexual intercourse. Erectile dysfunction may include:  Inability to get an erection.  Lack of enough hardness of the erection to allow penetration.  Loss of the erection before sex is finished. What are the causes? This condition may be caused by:  Certain medicines, such as: ? Pain relievers. ? Antihistamines. ? Antidepressants. ? Blood pressure medicines. ? Water pills (diuretics). ? Ulcer medicines. ? Muscle relaxants. ? Drugs.  Excessive drinking.  Psychological causes, such as: ? Anxiety. ? Depression. ? Sadness. ? Exhaustion. ? Performance fear. ? Stress.  Physical causes, such as: ? Artery problems. This may include diabetes, smoking, liver disease, or atherosclerosis. ? High blood pressure. ? Hormonal problems, such as low testosterone. ? Obesity. ? Nerve problems. This may include back or pelvic injuries, diabetes mellitus, multiple sclerosis, or Parkinson disease. What are the signs or symptoms? Symptoms of this condition include:  Inability to get an erection.  Lack of enough hardness of the erection to allow penetration.  Loss of the erection before sex is finished.  Normal erections at some times, but with frequent unsatisfactory episodes.  Low sexual satisfaction in either partner due to erection problems.  A curved penis occurring with erection. The curve may cause pain or the penis may be too curved to allow for intercourse.  Never having nighttime erections. How is this diagnosed? This condition is often diagnosed by:  Performing a physical exam to find other diseases or specific problems with the penis.  Asking you detailed questions about the problem.  Performing blood tests to check for diabetes mellitus or to measure hormone levels.  Performing other tests to check for underlying health conditions.  Performing an ultrasound  exam to check for scarring.  Performing a test to check blood flow to the penis.  Doing a sleep study at home to measure nighttime erections. How is this treated? This condition may be treated by:  Medicine taken by mouth to help you achieve an erection (oral medicine).  Hormone replacement therapy to replace low testosterone levels.  Medicine that is injected into the penis. Your health care provider may instruct you how to give yourself these injections at home.  Vacuum pump. This is a pump with a ring on it. The pump and ring are placed on the penis and used to create pressure that helps the penis become erect.  Penile implant surgery. In this procedure, you may receive: ? An inflatable implant. This consists of cylinders, a pump, and a reservoir. The cylinders can be inflated with a fluid that helps to create an erection, and they can be deflated after intercourse. ? A semi-rigid implant. This consists of two silicone rubber rods. The rods provide some rigidity. They are also flexible, so the penis can both curve downward in its normal position and become straight for sexual intercourse.  Blood vessel surgery, to improve blood flow to the penis. During this procedure, a blood vessel from a different part of the body is placed into the penis to allow blood to flow around (bypass) damaged or blocked blood vessels.  Lifestyle changes, such as exercising more, losing weight, and quitting smoking. Follow these instructions at home: Medicines   Take over-the-counter and prescription medicines only as told by your health care provider. Do not increase the dosage without first discussing it with your health care provider.  If you are using self-injections, perform injections as directed by your   health care provider. Make sure to avoid any veins that are on the surface of the penis. After giving an injection, apply pressure to the injection site for 5 minutes. General instructions   Exercise regularly, as directed by your health care provider. Work with your health care provider to lose weight, if needed.  Do not use any products that contain nicotine or tobacco, such as cigarettes and e-cigarettes. If you need help quitting, ask your health care provider.  Before using a vacuum pump, read the instructions that come with the pump and discuss any questions with your health care provider.  Keep all follow-up visits as told by your health care provider. This is important. Contact a health care provider if:  You feel nauseous.  You vomit. Get help right away if:  You are taking oral or injectable medicines and you have an erection that lasts longer than 4 hours. If your health care provider is unavailable, go to the nearest emergency room for evaluation. An erection that lasts much longer than 4 hours can result in permanent damage to your penis.  You have severe pain in your groin or abdomen.  You develop redness or severe swelling of your penis.  You have redness spreading up into your groin or lower abdomen.  You are unable to urinate.  You experience chest pain or a rapid heart beat (palpitations) after taking oral medicines. Summary  Erectile dysfunction (ED) is the inability to get or keep an erection during sexual intercourse. This problem can usually be treated successfully.  This condition is diagnosed based on a physical exam, your symptoms, and tests to determine the cause. Treatment varies depending on the cause, and may include medicines, hormone therapy, surgery, or vacuum pump.  You may need follow-up visits to make sure that you are using your medicines or devices correctly.  Get help right away if you are taking or injecting medicines and you have an erection that lasts longer than 4 hours. This information is not intended to replace advice given to you by your health care provider. Make sure you discuss any questions you have with your health care  provider. Document Released: 04/19/2000 Document Revised: 04/04/2017 Document Reviewed: 05/08/2016 Elsevier Patient Education  2020 [employer]vier Inc.  

## 2019-03-01 NOTE — Progress Notes (Signed)
Subjective:    Patient ID: Brandon Baker, male    DOB: March 04, 1981, 38 y.o. y.o.   MRN: [id number]  HPI  Pt is a 38 yo obese male with ADHD, OSA, hypogonadism, MDD who presents to the clinic with problems with erections. He has never had as much problems as he has recently with erections. He is taking testosterone and thought it would help some. He feels better with testosterone and adderall. He is getting more done but this is problematic for his "stress release". He is not in a current relationship but noticing he is not able to get hard enough to ejaculate.   .. Active Ambulatory Problems    Diagnosis Date Noted  . Tobacco abuse 08/07/2012  . OSA (obstructive sleep apnea) 03/25/2016  . Situational depression 06/04/2017  . Adjustment insomnia 06/04/2017  . Class 2 obesity due to excess calories without serious comorbidity with body mass index (BMI) of 39.0 to 39.9 in adult 06/04/2017  . Elevated ferritin 06/06/2017  . Vitamin D deficiency 06/06/2017  . IFG (impaired fasting glucose) 06/17/2017  . Male hypogonadism 10/31/2017  . Attention deficit disorder (ADD) without hyperactivity 10/31/2017  . Depression, major, single episode, mild (HCC) 08/24/2018  . Anxiety 08/24/2018  . Acute stress reaction 08/24/2018  . Erectile dysfunction 03/01/2019  . Elevated blood pressure reading 03/05/2019   Resolved Ambulatory Problems    Diagnosis Date Noted  . Chigger bite 11/24/2014  . Low back strain 08/01/2015  . Muscle spasm of back 08/01/2015  . Midline low back pain without sciatica 08/07/2015  . Elevated fasting glucose 06/06/2017  . No energy 06/06/2017  . Decreased testosterone level 06/17/2017   No Additional Past Medical History      Review of Systems    see hPI.  Objective:   Physical Exam Vitals signs reviewed.  Constitutional:      Appearance: Normal appearance. He is obese.  HENT:     Head: Normocephalic.  Cardiovascular:     Rate and Rhythm: Normal rate and regular  rhythm.  Pulmonary:     Effort: Pulmonary effort is normal.  Neurological:     General: No focal deficit present.     Mental Status: He is alert and oriented to person, place, and time.  Psychiatric:        Mood and Affect: Mood normal.           Assessment & Plan:  Marland KitchenMarland KitchenTyce was seen today for erectile dysfunction.  Diagnoses and all orders for this visit:  Erectile dysfunction, unspecified erectile dysfunction type -     sildenafil (VIAGRA) 100 MG tablet; Take 0.5-1 tablets (50-100 mg total) by mouth daily as needed for erectile dysfunction. -     Lipid Panel w/reflex Direct LDL -     COMPLETE METABOLIC PANEL WITH GFR -     TSH  Attention deficit disorder (ADD) without hyperactivity -     amphetamine-dextroamphetamine (ADDERALL XR) 30 MG 24 hr capsule; Take 1 capsule (30 mg total) by mouth every morning. -     amphetamine-dextroamphetamine (ADDERALL XR) 30 MG 24 hr capsule; Take 1 capsule (30 mg total) by mouth every morning. -     amphetamine-dextroamphetamine (ADDERALL XR) 30 MG 24 hr capsule; Take 1 capsule (30 mg total) by mouth daily.  Screening for diabetes mellitus -     COMPLETE METABOLIC PANEL WITH GFR  Screening for lipid disorders -     Lipid Panel w/reflex Direct LDL  Depression, major, single episode, mild (HCC)  Elevated  blood pressure reading     .Marland Kitchen Androgen Deficiency in the Aging Male    Brandon Baker Name 03/01/19 0900         Androgen Deficiency in the Aging Male   Do you have a decrease in libido (sex drive)  No     Do you have lack of energy  Yes     Do you have a decrease in strength and/or endurance  No     Have you lost height  No     Have you noticed a decreased "enjoyment of life"  No     Are you sad and/or grumpy  No     Are your erections less strong  Yes     Have you noticed a recent deterioration in your ability to play sports  No     Are you falling asleep after dinner  No     Has there been a recent deterioration in your work  performance  No       .. SHIM    Row Name 03/01/19 0953         SHIM: Over the last 6 months:   How do you rate your confidence that you could get and keep an erection?  High     When you had erections with sexual stimulation, how often were your erections hard enough for penetration (entering your partner)?  Most Times (much more than half the time)     During sexual intercourse, how often were you able to maintain your erection after you had penetrated (entered) your partner?  A Few Times (much less than half the time)     During sexual intercourse, how difficult was it to maintain your erection to completion of intercourse?  Very Difficult     When you attempted sexual intercourse, how often was it satisfactory for you?  A Few Times (much less than half the time)       SHIM Total Score   SHIM  14       Discussed many reasons for ED right now. I am concerned about stimulant causing this. At this point he does not want to D/C stimulant.  Refilled for 3 months.  Certainly SSRI could have effect as well. His mood is controlled.  Pt aware testosterone does not have effect on ED.  Will check labs as well.  viagra to use as needed. 1/2 to 1 full tablet 30 minutes before sexual activity.  HO given.   BP not controlled. undewr 140/90 is controlled. Need to work on low salt diet and exercise. If continues to be elevated will need to consider BP medication.

## 2019-03-05 ENCOUNTER — Encounter: Payer: Self-pay | Admitting: Physician Assistant

## 2019-03-05 DIAGNOSIS — R030 Elevated blood-pressure reading, without diagnosis of hypertension: Secondary | ICD-10-CM | POA: Insufficient documentation

## 2019-03-30 ENCOUNTER — Telehealth: Payer: Self-pay | Admitting: Physician Assistant

## 2019-03-30 NOTE — Telephone Encounter (Signed)
Received fax for prior authorization on Amphetamine Dextroamphet sent through cover my meds. Received fax for authorization valid 02/28/2019 - 03/29/2020. - CF

## 2019-04-14 ENCOUNTER — Other Ambulatory Visit: Payer: Self-pay | Admitting: Physician Assistant

## 2019-04-14 DIAGNOSIS — E291 Testicular hypofunction: Secondary | ICD-10-CM

## 2019-04-16 MED ORDER — TESTOSTERONE CYPIONATE 200 MG/ML IM SOLN: 200.0000 mg | INTRAMUSCULAR | 0 refills | Status: DC

## 2019-05-24 ENCOUNTER — Encounter: Payer: Self-pay | Admitting: Physician Assistant

## 2019-05-31 NOTE — Telephone Encounter (Signed)
FYI

## 2019-06-04 NOTE — Telephone Encounter (Signed)
Brandon Baker,  Can you call this patient and get a copy of his [facility] card?  His insurance changed last year and he came into the office twice and nobody ever got a copy of his insurance card.  Thanks so much!!  Toniann Fail

## 2019-07-20 ENCOUNTER — Encounter: Payer: Self-pay | Admitting: Physician Assistant

## 2019-07-20 ENCOUNTER — Other Ambulatory Visit: Payer: Self-pay | Admitting: Physician Assistant

## 2019-07-20 DIAGNOSIS — F988 Other specified behavioral and emotional disorders with onset usually occurring in childhood and adolescence: Secondary | ICD-10-CM

## 2019-07-21 ENCOUNTER — Encounter: Payer: Self-pay | Admitting: Physician Assistant

## 2019-07-21 ENCOUNTER — Telehealth (INDEPENDENT_AMBULATORY_CARE_PROVIDER_SITE_OTHER): Payer: 59 | Admitting: Physician Assistant

## 2019-07-21 VITALS — Temp 98.8°F | Ht 70.0 in | Wt 263.0 lb

## 2019-07-21 DIAGNOSIS — Z131 Encounter for screening for diabetes mellitus: Secondary | ICD-10-CM | POA: Diagnosis not present

## 2019-07-21 DIAGNOSIS — E291 Testicular hypofunction: Secondary | ICD-10-CM

## 2019-07-21 DIAGNOSIS — Z13220 Encounter for screening for lipoid disorders: Secondary | ICD-10-CM

## 2019-07-21 DIAGNOSIS — R7989 Other specified abnormal findings of blood chemistry: Secondary | ICD-10-CM

## 2019-07-21 DIAGNOSIS — R5383 Other fatigue: Secondary | ICD-10-CM

## 2019-07-21 DIAGNOSIS — F988 Other specified behavioral and emotional disorders with onset usually occurring in childhood and adolescence: Secondary | ICD-10-CM | POA: Diagnosis not present

## 2019-07-21 DIAGNOSIS — Z79899 Other long term (current) drug therapy: Secondary | ICD-10-CM

## 2019-07-21 MED ORDER — AMPHETAMINE-DEXTROAMPHET ER 30 MG PO CP24: 30.0000 mg | ORAL_CAPSULE | Freq: Every day | ORAL | 0 refills | Status: DC

## 2019-07-21 MED ORDER — AMPHETAMINE-DEXTROAMPHET ER 30 MG PO CP24: 30.0000 mg | ORAL_CAPSULE | ORAL | 0 refills | Status: DC

## 2019-07-21 NOTE — Progress Notes (Signed)
Patient ID: Opie Maclaughlin, male   DOB: 1980/10/30, 39 y.o.   MRN: [id number] .Marland KitchenVirtual Visit via Video Note  I connected with Keldan Gangemi on 07/21/19 at 10:50 AM EDT by a video enabled telemedicine application and verified that I am speaking with the correct person using two identifiers.  Location: Patient: home Provider: clinic   I discussed the limitations of evaluation and management by telemedicine and the availability of in person appointments. The patient expressed understanding and agreed to proceed.  History of Present Illness: Pt is a 39 yo male with ADHD, hypogonadism, OSA who calls into the clinic for medication refills.   He is doing well on adderall. He is in school and working and doing well. No side effects.   He is using testosterone bi-weekly. He is doing well.   He continues to be fatigued. He admits not sleeping enough hours. 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 No depression or anxiety.  .. Active Ambulatory Problems    Diagnosis Date Noted  . Tobacco abuse 08/07/2012  . OSA (obstructive sleep apnea) 03/25/2016  . Situational depression 06/04/2017  . Adjustment insomnia 06/04/2017  . Class 2 obesity due to excess calories without serious comorbidity with body mass index (BMI) of 39.0 to 39.9 in adult 06/04/2017  . Elevated ferritin 06/06/2017  . Vitamin D deficiency 06/06/2017  . IFG (impaired fasting glucose) 06/17/2017  . Male hypogonadism 10/31/2017  . Attention deficit disorder (ADD) without hyperactivity 10/31/2017  . Depression, major, single episode, mild (HCC) 08/24/2018  . Anxiety 08/24/2018  . Acute stress reaction 08/24/2018  . Erectile dysfunction 03/01/2019  . Elevated blood pressure reading 03/05/2019   Resolved Ambulatory Problems    Diagnosis Date Noted  . Chigger bite 11/24/2014  . Low back strain 08/01/2015  . Muscle spasm of back 08/01/2015  . Midline low back pain without sciatica 08/07/2015  . Elevated fasting glucose  06/06/2017  . No energy 06/06/2017  . Decreased testosterone level 06/17/2017   No Additional Past Medical History   Reviewed med, allergy, problem list.     Observations/Objective: No acute distress. Normal mood and appearance.   .. Today's Vitals   07/21/19 1033  Temp: 98.8 F (37.1 C)  TempSrc: Oral  Weight: 263 lb (119.3 kg)  Height: 5\' 10"  (1.778 m)   Body mass index is 37.74 kg/m.    Assessment and Plan: Marland KitchenNoemi was seen today for adhd.  Diagnoses and all orders for this visit:  Attention deficit disorder (ADD) without hyperactivity -     amphetamine-dextroamphetamine (ADDERALL XR) 30 MG 24 hr capsule; Take 1 capsule (30 mg total) by mouth every morning. -     amphetamine-dextroamphetamine (ADDERALL XR) 30 MG 24 hr capsule; Take 1 capsule (30 mg total) by mouth every morning. -     amphetamine-dextroamphetamine (ADDERALL XR) 30 MG 24 hr capsule; Take 1 capsule (30 mg total) by mouth daily.  Screening for lipid disorders -     Lipid Panel w/reflex Direct LDL  Screening for diabetes mellitus -     COMPLETE METABOLIC PANEL WITH GFR  Hypogonadism in male -     PSA -     Testosterone -     CBC  Medication management -     PSA -     Testosterone -     TSH -     CBC  No energy -     Testosterone -     TSH -     CBC  Elevated ferritin -     Fe+TIBC+Fer   Refilled adderall for 3 months.   Needs labs to evaluate testosterone. Needs screening labs and added labs to evaluate fatigue. Pt admits he is not getting enough sleep will work on that.   Also he never followed up on elevated ferritin. Sent labs to recheck this. Too high ferritin could also be causing fatigue.    Follow Up Instructions:    I discussed the assessment and treatment plan with the patient. The patient was provided an opportunity to ask questions and all were answered. The patient agreed with the plan and demonstrated an understanding of the instructions.   The patient was advised  to call back or seek an in-person evaluation if the symptoms worsen or if the condition fails to improve as anticipated.  I provided 15 minutes of non-face-to-face time during this encounter.   Iran Planas, PA-C

## 2019-07-26 ENCOUNTER — Encounter: Payer: Self-pay | Admitting: Physician Assistant

## 2019-08-19 IMAGING — DX DG CHEST 2V
2 series · 2 of 2 positions shown · non-contrast
Comparison: None.

CLINICAL DATA: Cough for 1 week

EXAM:
CHEST - 2 VIEW

[chest pa]
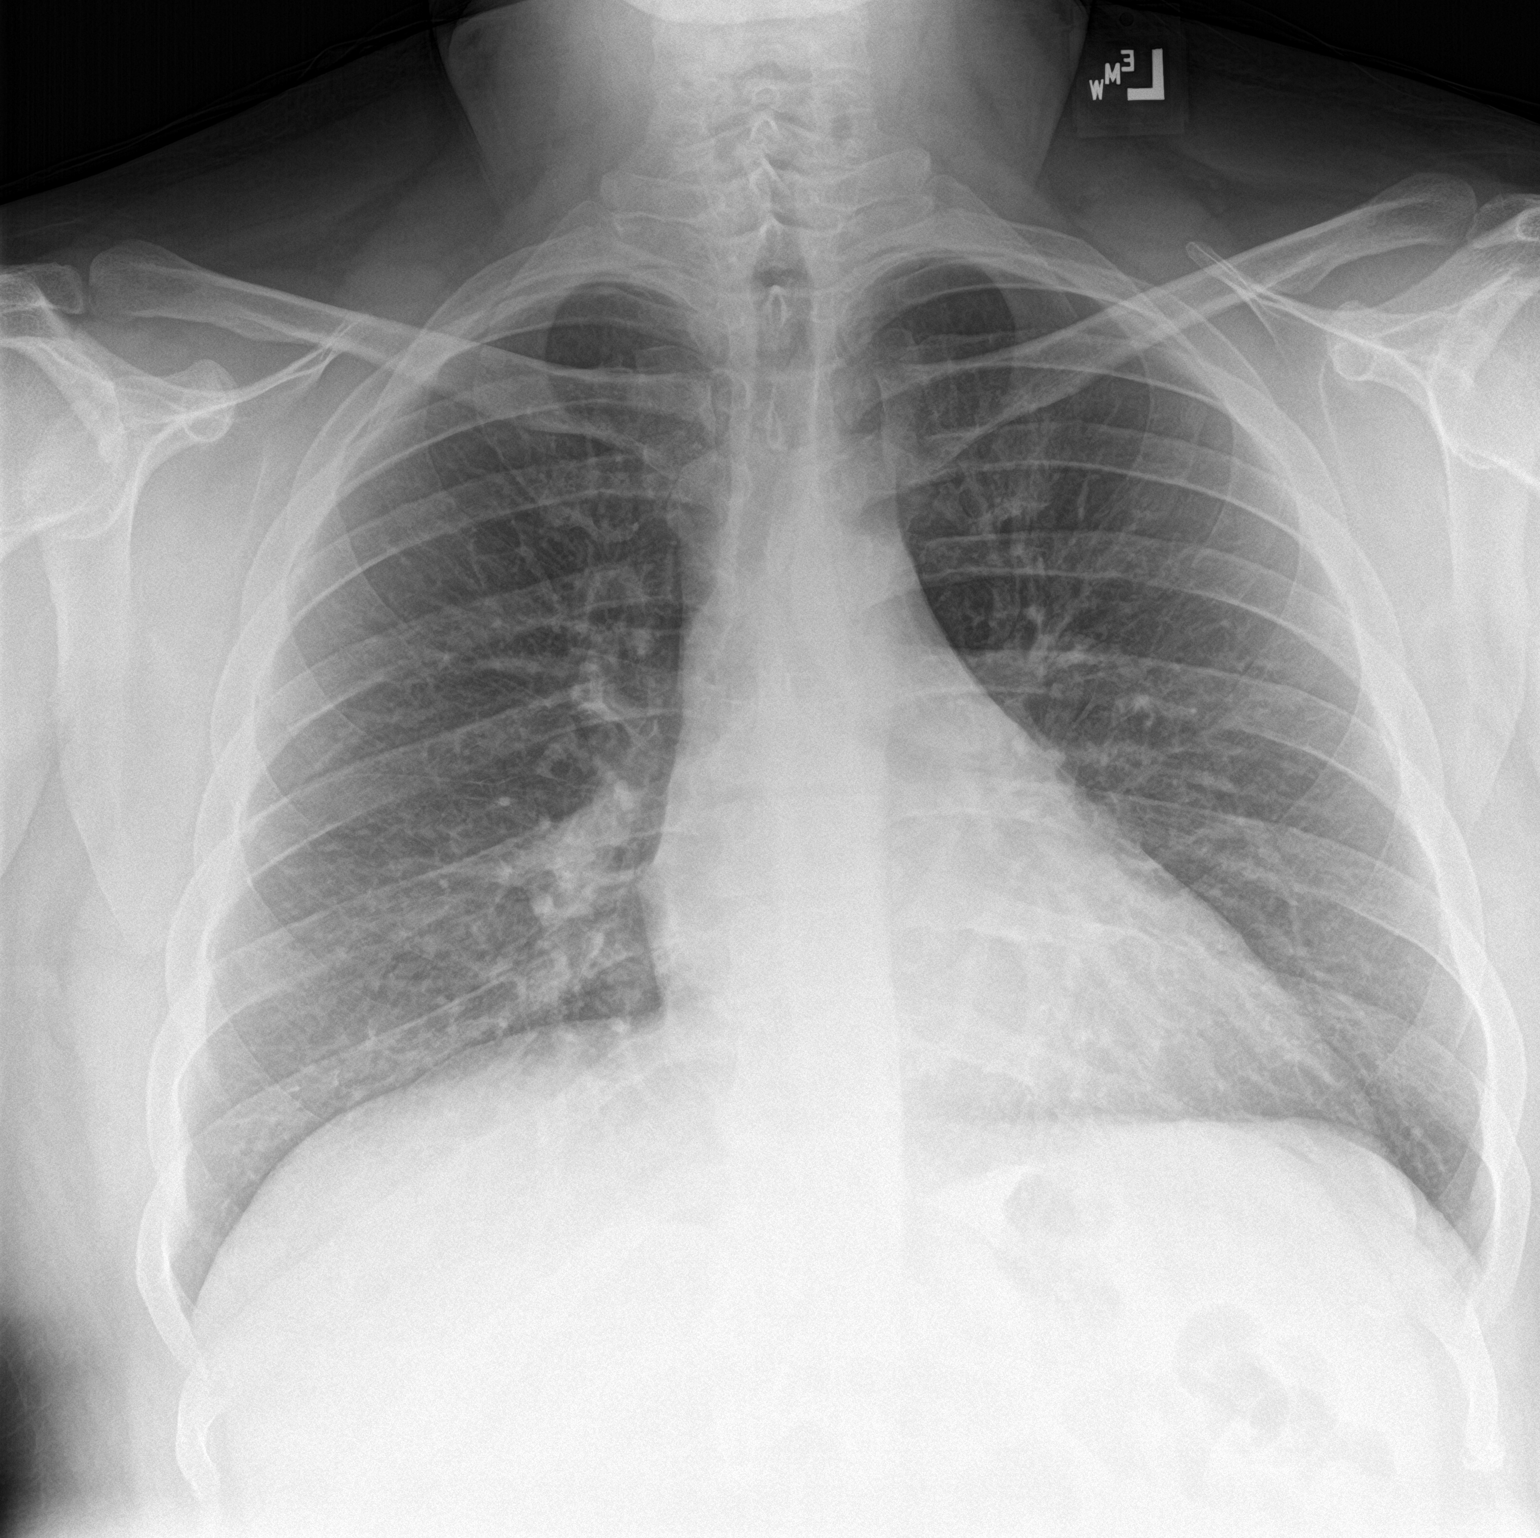

[chest lat]
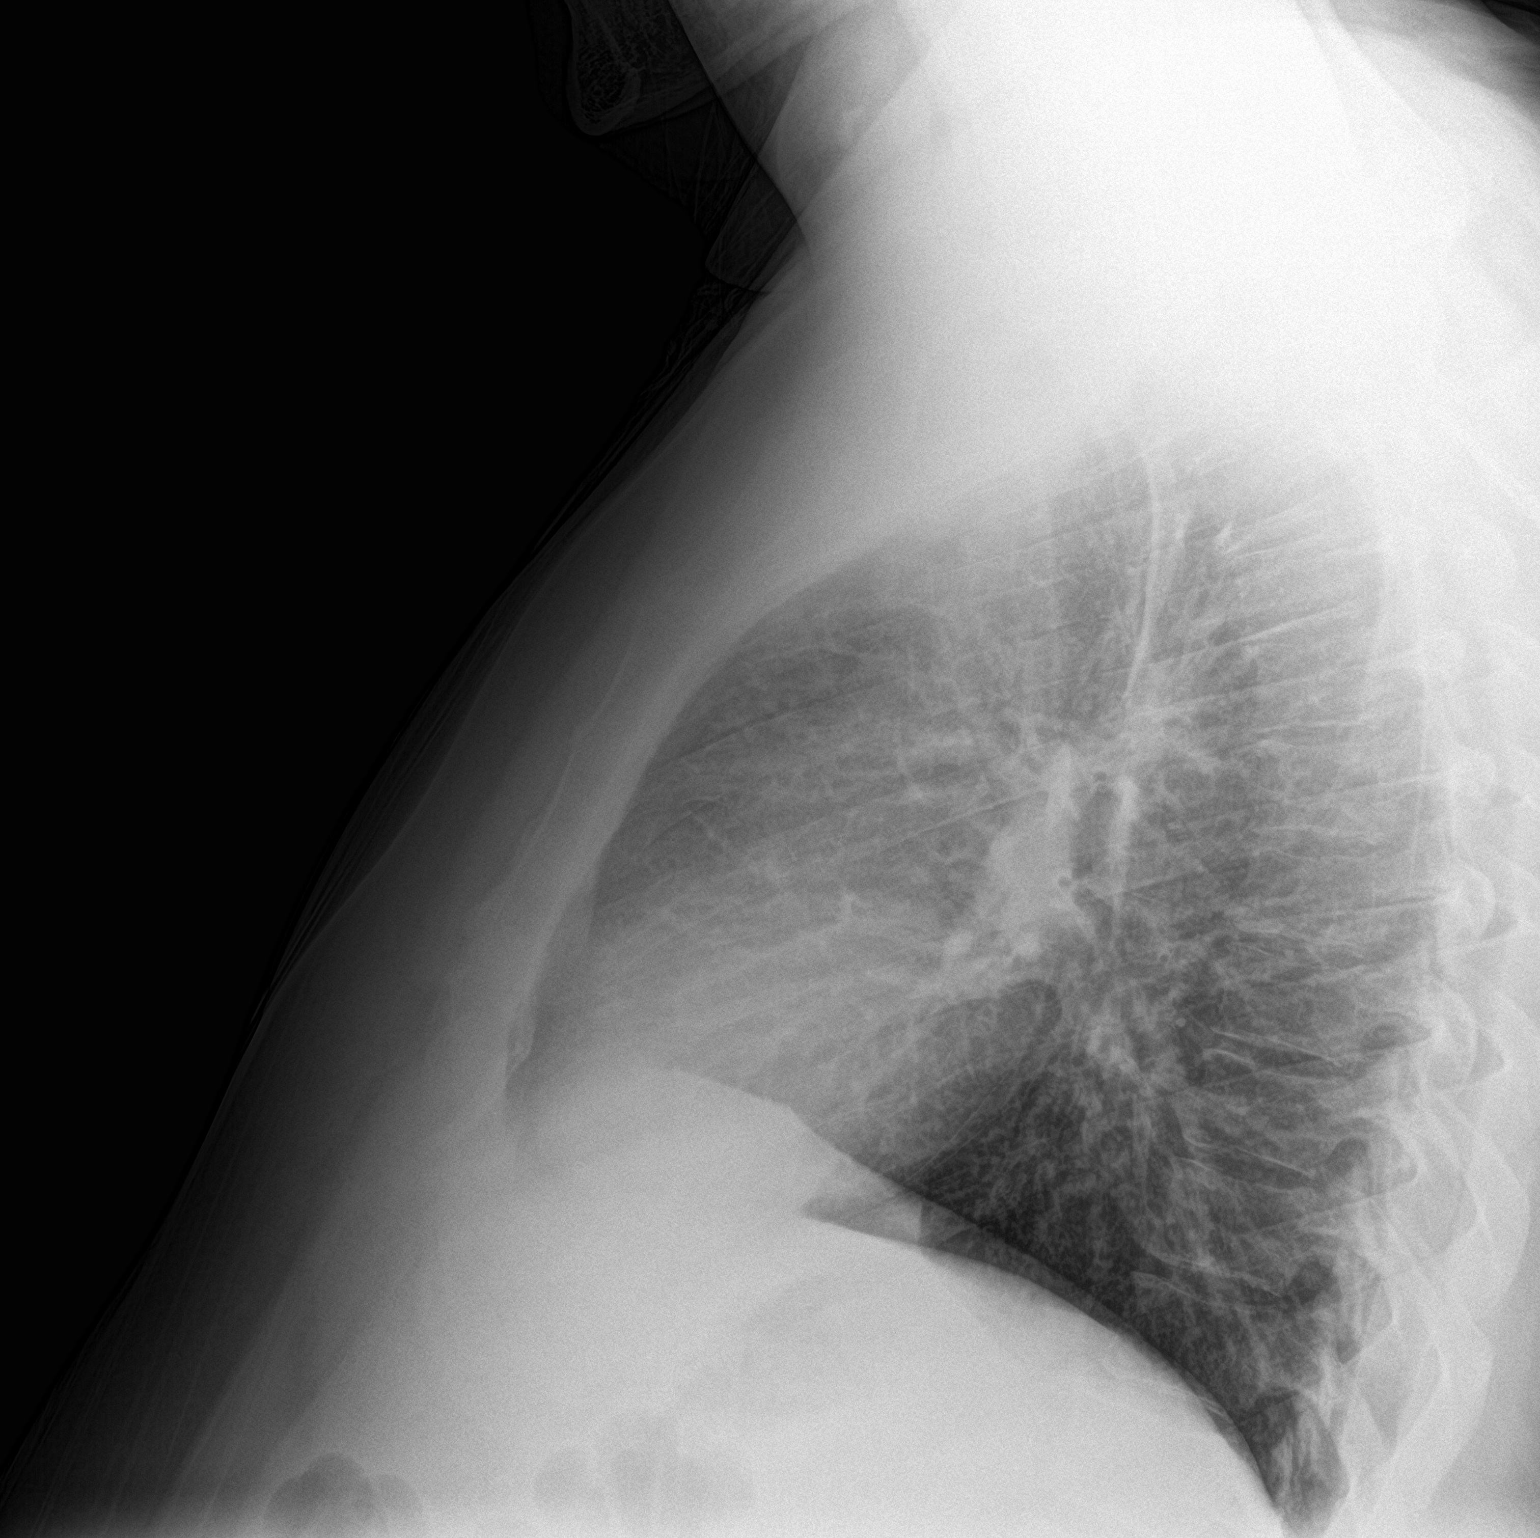

[2 of 2 positions shown; findings below may reference images not displayed]

FINDINGS: There is no edema or consolidation. The heart size and pulmonary
vascularity are normal. No adenopathy. No bone lesions.
IMPRESSION: No edema or consolidation.

## 2019-11-11 ENCOUNTER — Other Ambulatory Visit: Payer: Self-pay | Admitting: Physician Assistant

## 2019-11-11 DIAGNOSIS — E291 Testicular hypofunction: Secondary | ICD-10-CM

## 2019-11-12 ENCOUNTER — Other Ambulatory Visit: Payer: Self-pay | Admitting: Physician Assistant

## 2019-11-12 DIAGNOSIS — F988 Other specified behavioral and emotional disorders with onset usually occurring in childhood and adolescence: Secondary | ICD-10-CM

## 2019-11-12 DIAGNOSIS — E291 Testicular hypofunction: Secondary | ICD-10-CM

## 2019-11-12 MED ORDER — "BD LUER-LOCK SYRINGE 18G X 1-1/2"" 3 ML MISC": 0 refills | Status: DC

## 2019-11-12 MED ORDER — "BD DISP NEEDLES 22G X 1-1/2"" MISC": 0 refills | Status: DC

## 2019-11-12 MED ORDER — TESTOSTERONE CYPIONATE 200 MG/ML IM SOLN: 200.0000 mg | INTRAMUSCULAR | 0 refills | Status: DC

## 2019-11-12 MED ORDER — AMPHETAMINE-DEXTROAMPHET ER 30 MG PO CP24: 30.0000 mg | ORAL_CAPSULE | ORAL | 0 refills | Status: DC

## 2019-11-17 ENCOUNTER — Telehealth: Payer: Self-pay | Admitting: Physician Assistant

## 2019-11-17 NOTE — Telephone Encounter (Signed)
Received fax for PA on Testosterone sent through cover my meds waiting on determination. - CF 

## 2019-11-23 NOTE — Telephone Encounter (Signed)
Received fax from [facility] that Testosterone was approved from 10/18/2019 through 11/16/2020. Pharmacy notified and forms sent to scan.

## 2019-12-02 ENCOUNTER — Encounter: Payer: Self-pay | Admitting: Physician Assistant

## 2019-12-02 NOTE — Telephone Encounter (Signed)
Printed insurance and asked Helmut Muster to scan in pts chart and update in Epic

## 2019-12-17 ENCOUNTER — Other Ambulatory Visit: Payer: Self-pay | Admitting: Physician Assistant

## 2019-12-17 DIAGNOSIS — F988 Other specified behavioral and emotional disorders with onset usually occurring in childhood and adolescence: Secondary | ICD-10-CM

## 2019-12-17 NOTE — Telephone Encounter (Signed)
Last filled 11/12/2019 #30 with no refills Last appt 07/21/2019 - virtual, was to follow up in 3 months. Please advise.

## 2019-12-17 NOTE — Telephone Encounter (Signed)
He needs a visit- no more refills at this time

## 2019-12-17 NOTE — Telephone Encounter (Signed)
Please call patient to schedule follow up

## 2019-12-22 ENCOUNTER — Ambulatory Visit (INDEPENDENT_AMBULATORY_CARE_PROVIDER_SITE_OTHER): Payer: 59 | Admitting: Physician Assistant

## 2019-12-22 ENCOUNTER — Encounter: Payer: Self-pay | Admitting: Physician Assistant

## 2019-12-22 ENCOUNTER — Other Ambulatory Visit: Payer: Self-pay

## 2019-12-22 VITALS — BP 130/82 | HR 106 | Ht 70.0 in | Wt 293.0 lb

## 2019-12-22 DIAGNOSIS — F419 Anxiety disorder, unspecified: Secondary | ICD-10-CM

## 2019-12-22 DIAGNOSIS — F988 Other specified behavioral and emotional disorders with onset usually occurring in childhood and adolescence: Secondary | ICD-10-CM | POA: Diagnosis not present

## 2019-12-22 DIAGNOSIS — G4733 Obstructive sleep apnea (adult) (pediatric): Secondary | ICD-10-CM

## 2019-12-22 DIAGNOSIS — F320 Major depressive disorder, single episode, mild: Secondary | ICD-10-CM

## 2019-12-22 DIAGNOSIS — F5102 Adjustment insomnia: Secondary | ICD-10-CM | POA: Diagnosis not present

## 2019-12-22 DIAGNOSIS — E291 Testicular hypofunction: Secondary | ICD-10-CM

## 2019-12-22 MED ORDER — AMPHETAMINE-DEXTROAMPHET ER 30 MG PO CP24: 30.0000 mg | ORAL_CAPSULE | Freq: Every day | ORAL | 0 refills | Status: DC

## 2019-12-22 MED ORDER — AMPHETAMINE-DEXTROAMPHET ER 30 MG PO CP24: 30.0000 mg | ORAL_CAPSULE | ORAL | 0 refills | Status: DC

## 2019-12-22 MED ORDER — WEGOVY 0.25 MG/0.5ML ~~LOC~~ SOAJ: 0.2500 mg | SUBCUTANEOUS | 0 refills | Status: DC

## 2019-12-22 MED ORDER — TRAZODONE HCL 50 MG PO TABS: 25.0000 mg | ORAL_TABLET | Freq: Every evening | ORAL | 2 refills | Status: DC | PRN

## 2019-12-22 MED ORDER — VILAZODONE HCL 20 MG PO TABS: 1.0000 | ORAL_TABLET | Freq: Every day | ORAL | 1 refills | Status: DC

## 2019-12-22 NOTE — Progress Notes (Signed)
Subjective:    Patient ID: Brandon Baker, male    DOB: 23-Sep-1980, 39 y.o.   MRN: [id number]  HPI  Pt is a 39 yo obese male with ADD, OSA, hypogonadism, MDD who presents to the clinic for medication refill.   Pt is doing "ok". He doesn't have a lot of energy and gaining weight. He does feel more productive on adderall. Denies any CP, palpitations, headaches. He is having some problems sleeping.  He feels more down all the time. He denies any SI/HC. He does not have a lot of motivation. He feels over worked.   OSA-cannot tolerate CPAP machine.   He is taking testosterone shot every 2 weeks.   .. Active Ambulatory Problems    Diagnosis Date Noted   Tobacco abuse 08/07/2012   OSA (obstructive sleep apnea) 03/25/2016   Situational depression 06/04/2017   Adjustment insomnia 06/04/2017   Class 2 obesity due to excess calories without serious comorbidity with body mass index (BMI) of 39.0 to 39.9 in adult 06/04/2017   Elevated ferritin 06/06/2017   Vitamin D deficiency 06/06/2017   IFG (impaired fasting glucose) 06/17/2017   Male hypogonadism 10/31/2017   Attention deficit disorder (ADD) without hyperactivity 10/31/2017   Depression, major, single episode, mild (HCC) 08/24/2018   Anxiety 08/24/2018   Acute stress reaction 08/24/2018   Erectile dysfunction 03/01/2019   Elevated blood pressure reading 03/05/2019   Resolved Ambulatory Problems    Diagnosis Date Noted   Chigger bite 11/24/2014   Low back strain 08/01/2015   Muscle spasm of back 08/01/2015   Midline low back pain without sciatica 08/07/2015   Elevated fasting glucose 06/06/2017   No energy 06/06/2017   Decreased testosterone level 06/17/2017   No Additional Past Medical History     Review of Systems  All other systems reviewed and are negative.      Objective:   Physical Exam Vitals reviewed.  Constitutional:      Appearance: Normal appearance. He is obese.  Cardiovascular:      Rate and Rhythm: Normal rate and regular rhythm.     Pulses: Normal pulses.  Pulmonary:     Effort: Pulmonary effort is normal.     Breath sounds: Normal breath sounds.  Neurological:     General: No focal deficit present.     Mental Status: He is alert and oriented to person, place, and time.  Psychiatric:        Mood and Affect: Mood normal.       .. Depression screen [facility] 2/9 12/22/2019 07/21/2019 03/01/2019 08/21/2018 09/30/2017  Decreased Interest 1 0 1 3 1   Down, Depressed, Hopeless 1 0 0 3 1  PHQ - 2 Score 2 0 1 6 2   Altered sleeping 3 0 1 2 1   Tired, decreased energy 3 3 1 3 1   Change in appetite 2 0 0 0 1  Feeling bad or failure about yourself  1 0 0 0 1  Trouble concentrating 2 1 1 1  -  Moving slowly or fidgety/restless 0 0 0 3 0  Suicidal thoughts 0 0 0 0 0  PHQ-9 Score 13 4 4 15 6   Difficult doing work/chores Somewhat difficult Somewhat difficult Somewhat difficult Very difficult Somewhat difficult   . GAD 7 : Generalized Anxiety Score 12/22/2019 07/21/2019 03/01/2019 08/21/2018  Nervous, Anxious, on Edge 2 1 1 3   Control/stop worrying 1 0 0 2  Worry too much - different things 1 1 0 2  Trouble relaxing 2 3 0  3  Restless 1 1 0 3  Easily annoyed or irritable 2 1 0 3  Afraid - awful might happen 0 0 0 1  Total GAD 7 Score 9 7 1 17   Anxiety Difficulty Somewhat difficult Somewhat difficult Not difficult at all Very difficult        Assessment & Plan:  Marland KitchenCordelle was seen today for adhd.  Diagnoses and all orders for this visit:  Depression, major, single episode, mild (HCC) -     Vilazodone HCl 20 MG TABS; Take 1 tablet (20 mg total) by mouth daily.  Attention deficit disorder (ADD) without hyperactivity -     amphetamine-dextroamphetamine (ADDERALL XR) 30 MG 24 hr capsule; Take 1 capsule (30 mg total) by mouth every morning. -     amphetamine-dextroamphetamine (ADDERALL XR) 30 MG 24 hr capsule; Take 1 capsule (30 mg total) by mouth daily. -      amphetamine-dextroamphetamine (ADDERALL XR) 30 MG 24 hr capsule; Take 1 capsule (30 mg total) by mouth every morning.  Adjustment insomnia -     traZODone (DESYREL) 50 MG tablet; Take 0.5-1 tablets (25-50 mg total) by mouth at bedtime as needed for sleep.  Anxiety  Morbidly obese (HCC) -     Semaglutide-Weight Management (WEGOVY) 0.25 MG/0.5ML SOAJ; Inject 0.25 mg into the skin once a week.  Male hypogonadism  OSA (obstructive sleep apnea)   NEEDS labs to continue to get medications refilled. Pt aware.   PHQ/GAD up.  Refilled adderall.  Added viibryd. Discussed side effects.  Trazodone for sleep.   Lorin Picket.Discussed low carb diet with 1500 calories and 80g of protein.  Exercising at least 150 minutes a week.  [employer]itness Pal could be a Marland Kitchen.  Discussed wegovy. Sent to pharmacy. Will titrate up.  Call with tolerance in 1 month.  3 month in office.

## 2020-05-31 ENCOUNTER — Other Ambulatory Visit: Payer: Self-pay | Admitting: Physician Assistant

## 2020-05-31 DIAGNOSIS — F988 Other specified behavioral and emotional disorders with onset usually occurring in childhood and adolescence: Secondary | ICD-10-CM

## 2020-06-01 NOTE — Telephone Encounter (Signed)
Last written 02/19/2020 #30 no refills Last appt 12/22/2019

## 2020-06-02 MED ORDER — AMPHETAMINE-DEXTROAMPHET ER 30 MG PO CP24: 30.0000 mg | ORAL_CAPSULE | ORAL | 0 refills | Status: DC

## 2020-06-02 NOTE — Telephone Encounter (Signed)
Patient scheduled for f/u on 06/05/20.AM

## 2020-06-02 NOTE — Telephone Encounter (Signed)
Please call patient for appt.  

## 2020-06-05 ENCOUNTER — Other Ambulatory Visit: Payer: Self-pay

## 2020-06-05 ENCOUNTER — Ambulatory Visit (INDEPENDENT_AMBULATORY_CARE_PROVIDER_SITE_OTHER): Payer: BC Managed Care – PPO | Admitting: Physician Assistant

## 2020-06-05 ENCOUNTER — Encounter: Payer: Self-pay | Admitting: Physician Assistant

## 2020-06-05 VITALS — BP 149/82 | HR 102 | Ht 70.0 in | Wt 288.0 lb

## 2020-06-05 DIAGNOSIS — R030 Elevated blood-pressure reading, without diagnosis of hypertension: Secondary | ICD-10-CM | POA: Diagnosis not present

## 2020-06-05 DIAGNOSIS — Z13220 Encounter for screening for lipoid disorders: Secondary | ICD-10-CM | POA: Diagnosis not present

## 2020-06-05 DIAGNOSIS — R7989 Other specified abnormal findings of blood chemistry: Secondary | ICD-10-CM

## 2020-06-05 DIAGNOSIS — F5102 Adjustment insomnia: Secondary | ICD-10-CM

## 2020-06-05 DIAGNOSIS — E291 Testicular hypofunction: Secondary | ICD-10-CM | POA: Diagnosis not present

## 2020-06-05 DIAGNOSIS — F988 Other specified behavioral and emotional disorders with onset usually occurring in childhood and adolescence: Secondary | ICD-10-CM

## 2020-06-05 DIAGNOSIS — R5383 Other fatigue: Secondary | ICD-10-CM

## 2020-06-05 DIAGNOSIS — Z131 Encounter for screening for diabetes mellitus: Secondary | ICD-10-CM

## 2020-06-05 DIAGNOSIS — Z79899 Other long term (current) drug therapy: Secondary | ICD-10-CM

## 2020-06-05 MED ORDER — TESTOSTERONE CYPIONATE 200 MG/ML IM SOLN
200.0000 mg | Freq: Once | INTRAMUSCULAR | Status: AC
  Administered 2020-06-05: 200 mg via INTRAMUSCULAR

## 2020-06-05 MED ORDER — AMPHETAMINE-DEXTROAMPHET ER 30 MG PO CP24: 30.0000 mg | ORAL_CAPSULE | ORAL | 0 refills | Status: DC

## 2020-06-05 MED ORDER — AMPHETAMINE-DEXTROAMPHET ER 30 MG PO CP24: 30.0000 mg | ORAL_CAPSULE | Freq: Every day | ORAL | 0 refills | Status: DC

## 2020-06-05 MED ORDER — TRAZODONE HCL 50 MG PO TABS: 25.0000 mg | ORAL_TABLET | Freq: Every evening | ORAL | 2 refills | Status: DC | PRN

## 2020-06-05 NOTE — Progress Notes (Addendum)
Established Patient Office Visit  Subjective:  Patient ID: Brandon Baker, male    DOB: 03/26/1981  Age: 40 y.o. MRN: [id number]  CC:  Chief Complaint  Patient presents with  . ADHD    HPI Brandon Baker presents for ADD medication followup (Adderall XR).  His symptoms are well controlled.  He does report occasional difficulty sleeping, but that his Trazodone prescription helps.  He only needs this medication a handful of times per month. He denies palpitations, heart racing, reports of talking fast, diaphoresis.    He has not had follow up labs on his ferritin levels, and hasn't had testosterone injection in months due to not following up on labs.    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  He reports increased stress levels but is hopeful that his career is moving forward.    He has not been able to fill prescription for [facility] for weight management due to cost and insurance issues.   He has not followed up with the company for potential copay card/coupon card yet.      Family History  Problem Relation Age of Onset  . Alcohol abuse Mother   . Alcohol abuse Father   . Heart attack Maternal Uncle   . Hypertension Maternal Uncle   . Diabetes Maternal Grandmother   . Hyperlipidemia Maternal Grandmother   . Hypertension Maternal Grandmother   . Heart attack Maternal Grandfather   . Diabetes Maternal Grandfather   . Hyperlipidemia Maternal Grandfather   . Hypertension Maternal Grandfather     Social History   Socioeconomic History  . Marital status: Legally Separated    Spouse name: Not on file  . Number of children: Not on file  . Years of education: Not on file  . Highest education level: Not on file  Occupational History  . Not on file  Tobacco Use  . Smoking status: Former Smoker    Packs/day: 1.00    Types: Cigarettes, E-cigarettes    Quit date: 07/05/2014    Years since quitting: 5.9  .  Smokeless tobacco: Never Used  Vaping Use  . Vaping Use: Never used  Substance and Sexual Activity  . Alcohol use: Yes    Alcohol/week: 0.0 standard drinks  . Drug use: Not Currently  . Sexual activity: Yes  Other Topics Concern  . Not on file  Social History Narrative  . Not on file   Social Determinants of Health   Financial Resource Strain: Not on file  Food Insecurity: Not on file  Transportation Needs: Not on file  Physical Activity: Not on file  Stress: Not on file  Social Connections: Not on file  Intimate Partner Violence: Not on file    Outpatient Medications Prior to Visit  Medication Sig Dispense Refill  . amphetamine-dextroamphetamine (ADDERALL XR) 30 MG 24 hr capsule Take 1 capsule (30 mg total) by mouth daily. 30 capsule 0  . amphetamine-dextroamphetamine (ADDERALL XR) 30 MG 24 hr capsule Take 1 capsule (30 mg total) by mouth every morning. 30 capsule 0  . amphetamine-dextroamphetamine (ADDERALL XR) 30 MG 24 hr capsule Take 1 capsule (30 mg total) by mouth every morning. 30 capsule 0  . NEEDLE, DISP, 22 G (BD DISP NEEDLES) 22G X 1-1/2" MISC Use to administer testosterone 100 each 0  . SYRINGE-NEEDLE, DISP, 3 ML (BD LUER-LOCK SYRINGE) 18G X 1-1/2" 3 ML MISC Use to draw up testosterone 100 each 0  .  traZODone (DESYREL) 50 MG tablet Take 0.5-1 tablets (25-50 mg total) by mouth at bedtime as needed for sleep. 30 tablet 2  . Semaglutide-Weight Management (WEGOVY) 0.25 MG/0.5ML SOAJ Inject 0.25 mg into the skin once a week. 2 mL 0  . sildenafil (VIAGRA) 100 MG tablet Take 0.5-1 tablets (50-100 mg total) by mouth daily as needed for erectile dysfunction. 8 tablet 11  . Vilazodone HCl 20 MG TABS Take 1 tablet (20 mg total) by mouth daily. 30 tablet 1  . testosterone cypionate (DEPOTESTOSTERONE CYPIONATE) 200 MG/ML injection Inject 1 mL (200 mg total) into the muscle every 14 (fourteen) days. (Patient not taking: Reported on 06/05/2020) 10 mL 0   No facility-administered  medications prior to visit.    No Known Allergies  ROS Review of Systems  Constitutional: Negative for appetite change, diaphoresis and fatigue.  HENT: Negative for congestion, postnasal drip, rhinorrhea, sinus pain, sneezing and sore throat.   Eyes: Negative for redness and itching.  Respiratory: Negative for cough, chest tightness and shortness of breath.   Cardiovascular: Negative for chest pain and palpitations.  Gastrointestinal: Negative for abdominal distention and abdominal pain.  Endocrine: Negative for polydipsia and polyuria.  Genitourinary: Negative for dysuria, frequency and urgency.  Musculoskeletal: Negative for arthralgias and myalgias.  Skin: Negative for color change, pallor and rash.  Neurological: Negative for syncope, numbness and headaches.  Psychiatric/Behavioral: Positive for sleep disturbance. Negative for agitation, confusion and decreased concentration. The patient is nervous/anxious. The patient is not hyperactive.       Objective:    Physical Exam Constitutional:      General: He is not in acute distress.    Appearance: Normal appearance.  HENT:     Head: Normocephalic.  Eyes:     General: No scleral icterus.    Extraocular Movements: Extraocular movements intact.     Conjunctiva/sclera: Conjunctivae normal.     Pupils: Pupils are equal, round, and reactive to light.  Cardiovascular:     Rate and Rhythm: Normal rate and regular rhythm.     Pulses: Normal pulses.     Heart sounds: Normal heart sounds. No murmur heard. No friction rub. No gallop.   Pulmonary:     Effort: Pulmonary effort is normal.     Breath sounds: Normal breath sounds. No wheezing, rhonchi or rales.  Abdominal:     General: Abdomen is flat. There is no distension.  Musculoskeletal:        General: No tenderness, deformity or signs of injury.     Cervical back: Normal range of motion and neck supple.  Skin:    General: Skin is warm and dry.     Capillary Refill: Capillary  refill takes less than 2 seconds.     Coloration: Skin is not jaundiced.     Findings: No bruising or erythema.  Neurological:     General: No focal deficit present.     Mental Status: He is alert and oriented to person, place, and time. Mental status is at baseline.  Psychiatric:        Mood and Affect: Mood normal.        Behavior: Behavior normal.     BP (!) 149/82   Pulse (!) 102   Ht 5\' 10"  (1.778 m)   Wt 130.6 kg   SpO2 98%   BMI 41.32 kg/m  Wt Readings from Last 3 Encounters:  06/05/20 130.6 kg  12/22/19 132.9 kg  07/21/19 119.3 kg     Health Maintenance Due  Topic Date Due  . Hepatitis C Screening  Never done    There are no preventive care reminders to display for this patient.  Lab Results  Component Value Date   TSH 1.51 06/05/2017   Lab Results  Component Value Date   WBC 6.3 06/05/2017   HGB 16.7 06/05/2017   HCT 47.5 06/05/2017   MCV 85.7 06/05/2017   PLT 210 06/05/2017   Lab Results  Component Value Date   NA 140 06/05/2017   K 4.3 06/05/2017   CO2 31 06/05/2017   GLUCOSE 108 (H) 06/05/2017   BUN 12 06/05/2017   CREATININE 1.05 06/05/2017   BILITOT 0.4 06/05/2017   AST 28 06/05/2017   ALT 68 (H) 06/05/2017   PROT 7.0 06/05/2017   CALCIUM 9.6 06/05/2017   .Marland Kitchen Depression screen [facility] 2/9 06/05/2020 12/22/2019 07/21/2019 03/01/2019 08/21/2018  Decreased Interest 0 1 0 1 3  Down, Depressed, Hopeless 0 1 0 0 3  PHQ - 2 Score 0 2 0 1 6  Altered sleeping - 3 0 1 2  Tired, decreased energy - 3 3 1 3   Change in appetite - 2 0 0 0  Feeling bad or failure about yourself  - 1 0 0 0  Trouble concentrating - 2 1 1 1   Moving slowly or fidgety/restless - 0 0 0 3  Suicidal thoughts - 0 0 0 0  PHQ-9 Score - 13 4 4 15   Difficult doing work/chores - Somewhat difficult Somewhat difficult Somewhat difficult Very difficult   . GAD 7 : Generalized Anxiety Score 12/22/2019 07/21/2019 03/01/2019 08/21/2018  Nervous, Anxious, on Edge 2 1 1 3   Control/stop  worrying 1 0 0 2  Worry too much - different things 1 1 0 2  Trouble relaxing 2 3 0 3  Restless 1 1 0 3  Easily annoyed or irritable 2 1 0 3  Afraid - awful might happen 0 0 0 1  Total GAD 7 Score 9 7 1 17   Anxiety Difficulty Somewhat difficult Somewhat difficult Not difficult at all Very difficult       Assessment & Plan:  3/19/202110/28/2020Edd was seen today for adhd.  Diagnoses and all orders for this visit:  Male hypogonadism -     PSA -     Testosterone -     testosterone cypionate (DEPOTESTOSTERONE CYPIONATE) injection 200 mg  Screening for lipid disorders -     Lipid Panel w/reflex Direct LDL  Screening for diabetes mellitus -     COMPLETE METABOLIC PANEL WITH GFR  Elevated blood pressure reading -     COMPLETE METABOLIC PANEL WITH GFR  Medication management -     COMPLETE METABOLIC PANEL WITH GFR -     Lipid Panel w/reflex Direct LDL -     CBC with Differential/Platelet -     TSH -     PSA -     Testosterone -     Fe+TIBC+Fer  Elevated ferritin -     Fe+TIBC+Fer  No energy -     TSH  Adjustment insomnia -     traZODone (DESYREL) 50 MG tablet; Take 0.5-1 tablets (25-50 mg total) by mouth at bedtime as needed for sleep.  Attention deficit disorder (ADD) without hyperactivity -     amphetamine-dextroamphetamine (ADDERALL XR) 30 MG 24 hr capsule; Take 1 capsule (30 mg total) by mouth daily. -     amphetamine-dextroamphetamine (ADDERALL XR) 30 MG 24 hr capsule; Take 1 capsule (30 mg total) by  mouth every morning. -     amphetamine-dextroamphetamine (ADDERALL XR) 30 MG 24 hr capsule; Take 1 capsule (30 mg total) by mouth every morning.    ADD:  Refilled prescription Adderall, patient is tolerating medication well.  Fup in 3 months.    Hypogonadism/Abnormal Ferritin Level:  200 mg testosterone injection given in office.  Plan to have follow up labs including iron panel/ferritin in 1 week.  Informed patient that these labs are necessary if he wishes to continue  testosterone therapy.    Sleep disturbance:  Patient reports occasional issues with sleep, but when these occur are well controlled with Trazodone.  Plan to continue medication as necessary.    Follow-up: Return in about 3 months (around 09/02/2020), or if symptoms worsen or fail to improve.    Harlon Flor PA-C, have reviewed and agree with the above documentation in it's entirety.

## 2020-10-03 ENCOUNTER — Encounter: Payer: Self-pay | Admitting: Physician Assistant

## 2020-10-03 DIAGNOSIS — Z0000 Encounter for general adult medical examination without abnormal findings: Secondary | ICD-10-CM

## 2020-10-03 DIAGNOSIS — Z79899 Other long term (current) drug therapy: Secondary | ICD-10-CM

## 2020-10-03 DIAGNOSIS — E785 Hyperlipidemia, unspecified: Secondary | ICD-10-CM

## 2020-10-03 DIAGNOSIS — R790 Abnormal level of blood mineral: Secondary | ICD-10-CM

## 2020-10-03 DIAGNOSIS — N529 Male erectile dysfunction, unspecified: Secondary | ICD-10-CM

## 2020-10-03 DIAGNOSIS — R030 Elevated blood-pressure reading, without diagnosis of hypertension: Secondary | ICD-10-CM

## 2020-10-03 DIAGNOSIS — E291 Testicular hypofunction: Secondary | ICD-10-CM

## 2020-10-03 DIAGNOSIS — Z113 Encounter for screening for infections with a predominantly sexual mode of transmission: Secondary | ICD-10-CM

## 2020-10-03 DIAGNOSIS — R7301 Impaired fasting glucose: Secondary | ICD-10-CM

## 2020-10-03 MED ORDER — TINIDAZOLE 500 MG PO TABS: 2.0000 g | ORAL_TABLET | Freq: Once | ORAL | 0 refills | Status: AC

## 2020-10-03 NOTE — Telephone Encounter (Signed)
Ok to add HIV and RPR.

## 2020-10-04 NOTE — Addendum Note (Signed)
Addended by: Chalmers Cater on: 10/04/2020 01:51 PM   Modules accepted: Orders

## 2020-11-09 ENCOUNTER — Other Ambulatory Visit: Payer: Self-pay | Admitting: Physician Assistant

## 2020-11-09 DIAGNOSIS — F988 Other specified behavioral and emotional disorders with onset usually occurring in childhood and adolescence: Secondary | ICD-10-CM

## 2020-11-09 NOTE — Telephone Encounter (Signed)
Patient overdue for appt, tried to deny, will not let me.

## 2020-11-10 NOTE — Telephone Encounter (Signed)
Needs appt

## 2020-11-27 DIAGNOSIS — Z79899 Other long term (current) drug therapy: Secondary | ICD-10-CM | POA: Diagnosis not present

## 2020-11-27 DIAGNOSIS — E291 Testicular hypofunction: Secondary | ICD-10-CM | POA: Diagnosis not present

## 2020-11-27 DIAGNOSIS — E785 Hyperlipidemia, unspecified: Secondary | ICD-10-CM | POA: Diagnosis not present

## 2020-11-27 DIAGNOSIS — R790 Abnormal level of blood mineral: Secondary | ICD-10-CM | POA: Diagnosis not present

## 2020-11-27 DIAGNOSIS — Z113 Encounter for screening for infections with a predominantly sexual mode of transmission: Secondary | ICD-10-CM | POA: Diagnosis not present

## 2020-11-28 LAB — COMPLETE METABOLIC PANEL WITH GFR
AG Ratio: 1.6 (calc) (ref 1.0–2.5)
ALT: 53 U/L — ABNORMAL HIGH (ref 9–46)
AST: 34 U/L (ref 10–40)
Albumin: 4.3 g/dL (ref 3.6–5.1)
Alkaline phosphatase (APISO): 78 U/L (ref 36–130)
BUN: 11 mg/dL (ref 7–25)
CO2: 31 mmol/L (ref 20–32)
Calcium: 10.1 mg/dL (ref 8.6–10.3)
Chloride: 101 mmol/L (ref 98–110)
Creat: 1.02 mg/dL (ref 0.60–1.29)
Globulin: 2.7 g/dL (calc) (ref 1.9–3.7)
Glucose, Bld: 87 mg/dL (ref 65–99)
Potassium: 4.2 mmol/L (ref 3.5–5.3)
Sodium: 139 mmol/L (ref 135–146)
Total Bilirubin: 0.4 mg/dL (ref 0.2–1.2)
Total Protein: 7 g/dL (ref 6.1–8.1)
eGFR: 94 mL/min/{1.73_m2} (ref >=60)

## 2020-11-28 LAB — IRON,TIBC AND FERRITIN PANEL
%SAT: 55 % (calc) — ABNORMAL HIGH (ref 20–48)
Ferritin: 446 ng/mL — ABNORMAL HIGH (ref 38–380)
Iron: 150 ug/dL (ref 50–180)
TIBC: 273 mcg/dL (calc) (ref 250–425)

## 2020-11-28 LAB — CBC WITH DIFFERENTIAL/PLATELET
Absolute Monocytes: 798 cells/uL (ref 200–950)
Basophils Absolute: 63 cells/uL (ref 0–200)
Basophils Relative: 0.8 %
Eosinophils Absolute: 79 cells/uL (ref 15–500)
Eosinophils Relative: 1 %
HCT: 49.1 % (ref 38.5–50.0)
Hemoglobin: 16.9 g/dL (ref 13.2–17.1)
Lymphs Abs: 1975 cells/uL (ref 850–3900)
MCH: 31 pg (ref 27.0–33.0)
MCHC: 34.4 g/dL (ref 32.0–36.0)
MCV: 89.9 fL (ref 80.0–100.0)
MPV: 10 fL (ref 7.5–12.5)
Monocytes Relative: 10.1 %
Neutro Abs: 4985 cells/uL (ref 1500–7800)
Neutrophils Relative %: 63.1 %
Platelets: 242 10*3/uL (ref 140–400)
RBC: 5.46 10*6/uL (ref 4.20–5.80)
RDW: 13.1 % (ref 11.0–15.0)
Total Lymphocyte: 25 %
WBC: 7.9 10*3/uL (ref 3.8–10.8)

## 2020-11-28 LAB — HIV ANTIBODY (ROUTINE TESTING W REFLEX): HIV 1&2 Ab, 4th Generation: NONREACTIVE

## 2020-11-28 LAB — RPR: RPR Ser Ql: NONREACTIVE

## 2020-11-28 LAB — LIPID PANEL W/REFLEX DIRECT LDL
Cholesterol: 217 mg/dL — ABNORMAL HIGH (ref <200)
HDL: 52 mg/dL (ref >=40)
LDL Cholesterol (Calc): 140 mg/dL (calc) — ABNORMAL HIGH
Non-HDL Cholesterol (Calc): 165 mg/dL (calc) — ABNORMAL HIGH (ref <130)
Total CHOL/HDL Ratio: 4.2 (calc) (ref <5.0)
Triglycerides: 129 mg/dL (ref <150)

## 2020-11-28 LAB — TESTOSTERONE: Testosterone: 360 ng/dL (ref 250–827)

## 2020-11-28 LAB — PSA: PSA: 1.61 ng/mL (ref <=4.00)

## 2020-11-28 LAB — TSH: TSH: 2.46 mIU/L (ref 0.40–4.50)

## 2020-12-01 ENCOUNTER — Ambulatory Visit (INDEPENDENT_AMBULATORY_CARE_PROVIDER_SITE_OTHER): Payer: BC Managed Care – PPO | Admitting: Physician Assistant

## 2020-12-01 ENCOUNTER — Other Ambulatory Visit: Payer: Self-pay

## 2020-12-01 ENCOUNTER — Encounter: Payer: Self-pay | Admitting: Physician Assistant

## 2020-12-01 VITALS — BP 139/79 | HR 90 | Ht 70.0 in | Wt 289.0 lb

## 2020-12-01 DIAGNOSIS — F988 Other specified behavioral and emotional disorders with onset usually occurring in childhood and adolescence: Secondary | ICD-10-CM | POA: Diagnosis not present

## 2020-12-01 DIAGNOSIS — E291 Testicular hypofunction: Secondary | ICD-10-CM | POA: Diagnosis not present

## 2020-12-01 DIAGNOSIS — E785 Hyperlipidemia, unspecified: Secondary | ICD-10-CM | POA: Diagnosis not present

## 2020-12-01 DIAGNOSIS — G4733 Obstructive sleep apnea (adult) (pediatric): Secondary | ICD-10-CM | POA: Diagnosis not present

## 2020-12-01 MED ORDER — AMPHETAMINE-DEXTROAMPHET ER 30 MG PO CP24: 30.0000 mg | ORAL_CAPSULE | ORAL | 0 refills | Status: DC

## 2020-12-01 MED ORDER — TESTOSTERONE CYPIONATE 200 MG/ML IM SOLN: 100.0000 mg | INTRAMUSCULAR | 1 refills | Status: DC

## 2020-12-01 MED ORDER — "NEEDLE (DISP) 22G X 1-1/2"" MISC": 0 refills | Status: DC

## 2020-12-01 MED ORDER — AMPHETAMINE-DEXTROAMPHET ER 30 MG PO CP24: 30.0000 mg | ORAL_CAPSULE | Freq: Every day | ORAL | 0 refills | Status: DC

## 2020-12-01 MED ORDER — NEEDLE (DISP) 18G X 1" MISC
0 refills | Status: DC
Start: 2020-12-01 — End: 2021-09-03

## 2020-12-01 MED ORDER — WEGOVY 0.25 MG/0.5ML ~~LOC~~ SOAJ: 0.2500 mg | SUBCUTANEOUS | 0 refills | Status: DC

## 2020-12-01 NOTE — Progress Notes (Signed)
Subjective:    Patient ID: Brandon Baker, male    DOB: 06/18/1980, 40 y.o.   MRN: [id number]  HPI Pt is a 40 yo obese male with ADD, hypogonadism, HLD, OSA who presents to the clinic for medication management.   ADD- doing well. No problems or concerns. He continues to be in school and work full time.   He has missed a few doses of testosterone and can tell. He is more fatigued. Has OsA but cannot stand CPAP and not using.   Pt wants to lose weight. Interested in weight loss medications.    .. Active Ambulatory Problems    Diagnosis Date Noted   Tobacco abuse 08/07/2012   OSA (obstructive sleep apnea) 03/25/2016   Situational depression 06/04/2017   Adjustment insomnia 06/04/2017   Class 2 obesity due to excess calories without serious comorbidity with body mass index (BMI) of 39.0 to 39.9 in adult 06/04/2017   Elevated ferritin 06/06/2017   Vitamin D deficiency 06/06/2017   No energy 06/06/2017   IFG (impaired fasting glucose) 06/17/2017   Hypogonadism in male 10/31/2017   Attention deficit disorder (ADD) without hyperactivity 10/31/2017   Depression, major, single episode, mild (HCC) 08/24/2018   Anxiety 08/24/2018   Acute stress reaction 08/24/2018   Erectile dysfunction 03/01/2019   Elevated blood pressure reading 03/05/2019   Hyperlipidemia 12/01/2020   Resolved Ambulatory Problems    Diagnosis Date Noted   Chigger bite 11/24/2014   Low back strain 08/01/2015   Muscle spasm of back 08/01/2015   Midline low back pain without sciatica 08/07/2015   Elevated fasting glucose 06/06/2017   Decreased testosterone level 06/17/2017   No Additional Past Medical History      Review of Systems See HPI.     Objective:   Physical Exam Vitals reviewed.  Constitutional:      Appearance: Normal appearance. He is obese.  HENT:     Head: Normocephalic.  Cardiovascular:     Rate and Rhythm: Normal rate and regular rhythm.     Pulses: Normal pulses.     Heart sounds:  Normal heart sounds.  Pulmonary:     Effort: Pulmonary effort is normal.     Breath sounds: Normal breath sounds.  Neurological:     General: No focal deficit present.     Mental Status: He is alert.  Psychiatric:        Mood and Affect: Mood normal.          Assessment & Plan:  Marland KitchenMarland KitchenDwyne was seen today for adhd.  Diagnoses and all orders for this visit:  Attention deficit disorder (ADD) without hyperactivity -     amphetamine-dextroamphetamine (ADDERALL XR) 30 MG 24 hr capsule; Take 1 capsule (30 mg total) by mouth daily. -     amphetamine-dextroamphetamine (ADDERALL XR) 30 MG 24 hr capsule; Take 1 capsule (30 mg total) by mouth every morning. -     amphetamine-dextroamphetamine (ADDERALL XR) 30 MG 24 hr capsule; Take 1 capsule (30 mg total) by mouth every morning.  Hyperlipidemia, unspecified hyperlipidemia type  Hypogonadism in male -     testosterone cypionate (DEPOTESTOSTERONE CYPIONATE) 200 MG/ML injection; Inject 0.5 mLs (100 mg total) into the muscle every 14 (fourteen) days. -     NEEDLE, DISP, 18 G 18G X 1" MISC; As directed to DRAW Testosterone from vial -     NEEDLE, DISP, 22 G 22G X 1-1/2" MISC; As directed to ADMINISTER Testosterone IM injection  OSA (obstructive sleep apnea)  Morbidly obese (  HCC) -     WEGOVY 0.25 MG/0.5ML SOAJ; Inject 0.25 mg into the skin once a week. Use this dose for 1 month (4 shots) and then increase to next higher dose.  Refilled medications.  Reviewed labs.   Marland Kitchen.Discussed low carb diet with 1500 calories and 80g of protein.  Exercising at least 150 minutes a week.  [employer] could be a Chief Technology Officer.  Started [employer]. Discussed titration up and side effects.  Follow up in 3 months in office.

## 2020-12-11 ENCOUNTER — Encounter: Payer: Self-pay | Admitting: Physician Assistant

## 2021-01-16 ENCOUNTER — Telehealth: Payer: Self-pay

## 2021-01-16 NOTE — Telephone Encounter (Signed)
Medication: WEGOVY 0.25 MG/0.5ML SOAJ Prior authorization submitted via CoverMyMeds on 01/15/2021 PA submission pending

## 2021-01-30 NOTE — Telephone Encounter (Signed)
Medication: WEGOVY 0.25 MG/0.5ML SOAJ Prior authorization determination received Medication has been denied Reason for denial: "This medication is not on the formulary."

## 2021-02-09 ENCOUNTER — Telehealth: Payer: Self-pay

## 2021-02-09 NOTE — Telephone Encounter (Signed)
Medication: testosterone cypionate (DEPOTESTOSTERONE CYPIONATE) 200 MG/ML injection Prior authorization determination received Medication has been approved Approval dates: 01/15/2021-01/14/2022  Patient aware via: MyChart Pharmacy aware: Yes Provider aware via this encounter

## 2021-03-13 ENCOUNTER — Encounter: Payer: Self-pay | Admitting: Physician Assistant

## 2021-03-13 ENCOUNTER — Encounter: Payer: Self-pay | Admitting: Medical-Surgical

## 2021-03-13 ENCOUNTER — Other Ambulatory Visit: Payer: Self-pay

## 2021-03-13 ENCOUNTER — Ambulatory Visit (INDEPENDENT_AMBULATORY_CARE_PROVIDER_SITE_OTHER): Payer: BC Managed Care – PPO | Admitting: Medical-Surgical

## 2021-03-13 ENCOUNTER — Ambulatory Visit (INDEPENDENT_AMBULATORY_CARE_PROVIDER_SITE_OTHER): Payer: BC Managed Care – PPO

## 2021-03-13 VITALS — BP 146/82 | HR 78 | Temp 98.5°F | Ht 70.0 in | Wt 283.0 lb

## 2021-03-13 DIAGNOSIS — Y92009 Unspecified place in unspecified non-institutional (private) residence as the place of occurrence of the external cause: Secondary | ICD-10-CM

## 2021-03-13 DIAGNOSIS — W19XXXA Unspecified fall, initial encounter: Secondary | ICD-10-CM

## 2021-03-13 DIAGNOSIS — R0781 Pleurodynia: Secondary | ICD-10-CM | POA: Diagnosis not present

## 2021-03-13 MED ORDER — TRAMADOL HCL 50 MG PO TABS: 50.0000 mg | ORAL_TABLET | Freq: Three times a day (TID) | ORAL | 0 refills | Status: AC | PRN

## 2021-03-13 NOTE — Telephone Encounter (Signed)
Called pt and scheduled him for 4pm today with Christen Butter, DNP.  Tiajuana Amass, CMA

## 2021-03-13 NOTE — Progress Notes (Signed)
  HPI with pertinent ROS:   CC: Left rib pain  HPI: Pleasant 40 year old male presenting today with reports of a fall at home in his garage last week.  This happened Sunday before last when he was trying to catch his dog who was running away through the garage.  Notes that his foot landed on the runner that was near the door and the runner slid 1 way while he fell the other.  He landed on his left side and even though he tried to brace his fall with his left arm, his arm folded and was along his side at the area of his rib pain.  His pain did gradually improve over the week and was doing much better but this morning, he went to bend over and felt a pop on the left side of his lower ribs.  Since then he has had a constant 5-10/2008 pain that is worse with movement.  He used conservative measures last week to help with his discomfort but has not done any of those today.  Denies fever, chills, shortness of breath, cough, and hemoptysis.  I reviewed the past medical history, family history, social history, surgical history, and allergies today and no changes were needed.  Please see the problem list section below in epic for further details.   Physical exam:   General: Well Developed, well nourished, and in no acute distress.  Neuro: Alert and oriented x3.  HEENT: Normocephalic, atraumatic.  Skin: Warm and dry. Cardiac: Regular rate and rhythm, no murmurs rubs or gallops, no lower extremity edema.  Respiratory: Clear to auscultation bilaterally. Not using accessory muscles, speaking in full sentences. Ribs: Palpation of the left lower ribs shows significant tenderness along the lateral areas of the lower ribs with no step-offs or significant deformity noted.  Impression and Recommendations:    1. Rib pain on left side 2. Fall in home, initial encounter Getting chest x-ray with rib films for evaluation of fracture.  Reviewed conservative measures including heat, ice, gentle stretching, rest, and  bracing with pillows.  Sending in a short supply of tramadol 50-100 mg every 8 hours as needed for pain.  Advised to take this with Tylenol if desired for added pain relief. - DG Ribs Unilateral W/Chest Left; Future  Return if symptoms worsen or fail to improve. ___________________________________________ Thayer Ohm, DNP, APRN, FNP-BC Primary Care and Sports Medicine [facility]

## 2021-03-27 ENCOUNTER — Encounter: Payer: Self-pay | Admitting: Physician Assistant

## 2021-03-27 ENCOUNTER — Other Ambulatory Visit: Payer: Self-pay | Admitting: Physician Assistant

## 2021-03-27 DIAGNOSIS — F988 Other specified behavioral and emotional disorders with onset usually occurring in childhood and adolescence: Secondary | ICD-10-CM

## 2021-04-06 ENCOUNTER — Encounter: Payer: Self-pay | Admitting: Medical-Surgical

## 2021-05-03 ENCOUNTER — Encounter: Payer: Self-pay | Admitting: Physician Assistant

## 2021-05-04 ENCOUNTER — Telehealth (INDEPENDENT_AMBULATORY_CARE_PROVIDER_SITE_OTHER): Payer: BC Managed Care – PPO | Admitting: Family Medicine

## 2021-05-04 ENCOUNTER — Encounter: Payer: Self-pay | Admitting: Family Medicine

## 2021-05-04 DIAGNOSIS — U071 COVID-19: Secondary | ICD-10-CM | POA: Diagnosis not present

## 2021-05-04 MED ORDER — HYDROCOD POLST-CPM POLST ER 10-8 MG/5ML PO SUER: 5.0000 mL | Freq: Two times a day (BID) | ORAL | 0 refills | Status: AC | PRN

## 2021-05-04 NOTE — Progress Notes (Signed)
Pt took Covid test yesterday it was positive.  Sxs started on Wednesday. Cough,chills, fever (100- yesterday). No temp today. He has been taking Alka Seltzer plus.  He reports that the cough wakes him up at night.

## 2021-05-04 NOTE — Progress Notes (Signed)
Virtual Video Visit via MyChart Note  I connected with  Brandon Baker on 05/04/21 at  3:00 PM EST by the video enabled telemedicine application for MyChart, and verified that I am speaking with the correct person using two identifiers.   I introduced myself as a Publishing rights manager with the practice. We discussed the limitations of evaluation and management by telemedicine and the availability of in person appointments. The patient expressed understanding and agreed to proceed.  Participating parties in this visit include: The patient and the nurse practitioner listed.  The patient is: At home I am: In the office - Primary Care Kathryne Sharper  Subjective:    CC:  Chief Complaint  Patient presents with   Covid Positive    HPI: Brandon Baker is a 40 y.o. year old male presenting today via MyChart today for + COVID test.  Symptoms started on Wednesday and patient had positive test yesterday. He has been experiencing fever, chills, cough, nasal congestion, sinus pressure. Reports the cough keeps him up at night. He has been getting some relief with Alka Seltzer Plus. He denies any chest pain, wheezing, palpitations, headaches, GI/GU symptoms.      Past medical history, Surgical history, Family history not pertinant except as noted below, Social history, Allergies, and medications have been entered into the medical record, reviewed, and corrections made.   Review of Systems:  All review of systems negative except what is listed in the HPI   Objective:    General:  Speaking clearly in complete sentences. Absent shortness of breath noted.   Alert and oriented x3.   Normal judgment.  Absent acute distress.   Impression and Recommendations:    1. COVID-19 - chlorpheniramine-HYDROcodone (TUSSIONEX) 10-8 MG/5ML SUER; Take 5 mLs by mouth every 12 (twelve) hours as needed for up to 5 days for cough (cough, will cause drowsiness.).  Dispense: 50 mL; Refill: 0  Patient doing okay overall.  No acute distress. Deferred antiviral therapy. He would like some cough syrup since the cough is keeping him from sleeping. Continue supportive measures including rest, hydration, humidifier use, steam showers, warm compresses to sinuses, warm liquids with lemon and honey, and over-the-counter cough, cold, and analgesics as needed.  Patient aware of signs/symptoms requiring further/urgent evaluation.    Follow-up if symptoms worsen or fail to improve.    I discussed the assessment and treatment plan with the patient. The patient was provided an opportunity to ask questions and all were answered. The patient agreed with the plan and demonstrated an understanding of the instructions.   The patient was advised to call back or seek an in-person evaluation if the symptoms worsen or if the condition fails to improve as anticipated.  I spent 20 minutes dedicated to the care of this patient on the date of this encounter to include pre-visit chart review of prior notes and results, face-to-face time with the patient, and post-visit ordering of testing as indicated.   Clayborne Dana, NP

## 2021-05-28 ENCOUNTER — Other Ambulatory Visit: Payer: Self-pay | Admitting: Physician Assistant

## 2021-05-28 DIAGNOSIS — F988 Other specified behavioral and emotional disorders with onset usually occurring in childhood and adolescence: Secondary | ICD-10-CM

## 2021-05-28 NOTE — Telephone Encounter (Signed)
Last written 03/27/2021 #30 with no refills  Last appt 12/01/2020, has had acute visits since

## 2021-05-28 NOTE — Telephone Encounter (Signed)
Needs appt

## 2021-05-28 NOTE — Telephone Encounter (Signed)
LVM for patient to call back to schedule f/u with PCP for any further refills. AM

## 2021-05-28 NOTE — Telephone Encounter (Signed)
Needs appt. Last refill.

## 2021-06-04 ENCOUNTER — Other Ambulatory Visit: Payer: Self-pay

## 2021-06-04 ENCOUNTER — Encounter: Payer: Self-pay | Admitting: Physician Assistant

## 2021-06-04 ENCOUNTER — Ambulatory Visit: Payer: BC Managed Care – PPO | Admitting: Physician Assistant

## 2021-06-04 VITALS — BP 144/96 | HR 86 | Resp 16 | Ht 70.0 in | Wt 296.1 lb

## 2021-06-04 DIAGNOSIS — E291 Testicular hypofunction: Secondary | ICD-10-CM | POA: Diagnosis not present

## 2021-06-04 DIAGNOSIS — F5102 Adjustment insomnia: Secondary | ICD-10-CM

## 2021-06-04 DIAGNOSIS — R030 Elevated blood-pressure reading, without diagnosis of hypertension: Secondary | ICD-10-CM

## 2021-06-04 DIAGNOSIS — Z6841 Body Mass Index (BMI) 40.0 and over, adult: Secondary | ICD-10-CM

## 2021-06-04 DIAGNOSIS — F988 Other specified behavioral and emotional disorders with onset usually occurring in childhood and adolescence: Secondary | ICD-10-CM

## 2021-06-04 MED ORDER — AMPHETAMINE-DEXTROAMPHET ER 30 MG PO CP24: 30.0000 mg | ORAL_CAPSULE | ORAL | 0 refills | Status: DC

## 2021-06-04 MED ORDER — AMPHETAMINE-DEXTROAMPHET ER 30 MG PO CP24: 30.0000 mg | ORAL_CAPSULE | Freq: Every day | ORAL | 0 refills | Status: DC

## 2021-06-04 MED ORDER — TRAZODONE HCL 50 MG PO TABS: 25.0000 mg | ORAL_TABLET | Freq: Every evening | ORAL | 1 refills | Status: DC | PRN

## 2021-06-04 MED ORDER — TESTOSTERONE CYPIONATE 200 MG/ML IM SOLN: 200.0000 mg | INTRAMUSCULAR | 0 refills | Status: DC

## 2021-06-04 NOTE — Progress Notes (Signed)
Subjective:    Patient ID: Brandon Baker, male    DOB: [birth year], 41 y.o.   MRN: [id number]  HPI Pt is a 41 yo obese male with ADHD, hypogonadism, OSA who presents to the clinic for 3 month follow up and medication refills.   Not checking BP at home. No CP, palpitations, headaches, dizziness or vision changes.   He has been out of adderall. Struggling at work without it. Not exercising.   Taking testosterone but has missed some doses.   Continues to be tired. Not using CPAP due to mask issues.   .. Active Ambulatory Problems    Diagnosis Date Noted   Tobacco abuse 08/07/2012   OSA (obstructive sleep apnea) 03/25/2016   Situational depression 06/04/2017   Adjustment insomnia 06/04/2017   Class 3 severe obesity due to excess calories without serious comorbidity with body mass index (BMI) of 40.0 to 44.9 in adult (HCC) 06/04/2017   Elevated ferritin 06/06/2017   Vitamin D deficiency 06/06/2017   No energy 06/06/2017   IFG (impaired fasting glucose) 06/17/2017   Hypogonadism in male 10/31/2017   Attention deficit disorder (ADD) without hyperactivity 10/31/2017   Depression, major, single episode, mild (HCC) 08/24/2018   Anxiety 08/24/2018   Acute stress reaction 08/24/2018   Erectile dysfunction 03/01/2019   Elevated blood pressure reading 03/05/2019   Hyperlipidemia 12/01/2020   Resolved Ambulatory Problems    Diagnosis Date Noted   Chigger bite 11/24/2014   Low back strain 08/01/2015   Muscle spasm of back 08/01/2015   Midline low back pain without sciatica 08/07/2015   Elevated fasting glucose 06/06/2017   Decreased testosterone level 06/17/2017   No Additional Past Medical History       Review of Systems  All other systems reviewed and are negative.     Objective:   Physical Exam Vitals reviewed.  Constitutional:      Appearance: Normal appearance. He is obese.  HENT:     Head: Normocephalic.  Neck:     Vascular: No carotid bruit.  Cardiovascular:      Rate and Rhythm: Normal rate and regular rhythm.     Pulses: Normal pulses.     Heart sounds: Normal heart sounds.  Pulmonary:     Effort: Pulmonary effort is normal.     Breath sounds: Normal breath sounds.  Neurological:     Mental Status: He is alert and oriented to person, place, and time.  Psychiatric:        Mood and Affect: Mood normal.          Assessment & Plan:  Marland KitchenMarland KitchenLeonard was seen today for medication management.  Diagnoses and all orders for this visit:  Attention deficit disorder (ADD) without hyperactivity -     amphetamine-dextroamphetamine (ADDERALL XR) 30 MG 24 hr capsule; Take 1 capsule (30 mg total) by mouth daily. -     amphetamine-dextroamphetamine (ADDERALL XR) 30 MG 24 hr capsule; Take 1 capsule (30 mg total) by mouth every morning. -     amphetamine-dextroamphetamine (ADDERALL XR) 30 MG 24 hr capsule; Take 1 capsule (30 mg total) by mouth every morning.  Hypogonadism in male -     testosterone cypionate (DEPOTESTOSTERONE CYPIONATE) 200 MG/ML injection; Inject 1 mL (200 mg total) into the muscle every 14 (fourteen) days.  Adjustment insomnia -     traZODone (DESYREL) 50 MG tablet; Take 0.5-1 tablets (25-50 mg total) by mouth at bedtime as needed for sleep.  Elevated blood pressure reading  Class 3 severe obesity  due to excess calories without serious comorbidity with body mass index (BMI) of 40.0 to 44.9 in adult [facility])   Refilled adderall for 3 months.  BP elevated today. Went down on 2nd recheck.  Watch alcohol intake. Start exercising. Walk salt intake. If above 140/90 in 3 months need to start medication.   Not using CPAP hates the mask. Using trazodone to get to sleep and needs refills  Continue testosterone. Sent refills. Recheck labs in 3 months. Been out of it some off and on.

## 2021-06-05 MED ORDER — LISDEXAMFETAMINE DIMESYLATE 30 MG PO CAPS: 30.0000 mg | ORAL_CAPSULE | Freq: Every day | ORAL | 0 refills | Status: DC

## 2021-06-08 ENCOUNTER — Telehealth: Payer: Self-pay

## 2021-06-08 NOTE — Telephone Encounter (Addendum)
Resubmitted  Prior authorization for: lisdexamfetamine (VYVANSE) 30 MG capsule  Via: Covermymeds Case/Key:[id number]) - [id number] Status: N/A as of 06/08/21 Reason:This medication or product is on your plan's list of covered drugs. Prior authorization is not required at this time.Called pt pharmacy  and they ran pt new ins card medication is covered but can only fill Vyvanse 30 days at a time gave a verbal, pt needs new rx script for 30/30. Notified Pt via: Mychart, tried calling pt left detailed vm.

## 2021-06-12 MED ORDER — LISDEXAMFETAMINE DIMESYLATE 30 MG PO CAPS: 30.0000 mg | ORAL_CAPSULE | Freq: Every day | ORAL | 0 refills | Status: DC

## 2021-06-12 NOTE — Addendum Note (Signed)
Addended by: Jomarie Longs on: 06/12/2021 04:46 PM   Modules accepted: Orders

## 2021-06-12 NOTE — Telephone Encounter (Signed)
Sent vyvanse 

## 2021-09-03 ENCOUNTER — Ambulatory Visit: Payer: 59 | Admitting: Physician Assistant

## 2021-09-03 VITALS — BP 152/87 | HR 92 | Ht 70.0 in | Wt 304.0 lb

## 2021-09-03 DIAGNOSIS — E291 Testicular hypofunction: Secondary | ICD-10-CM | POA: Diagnosis not present

## 2021-09-03 DIAGNOSIS — Z6841 Body Mass Index (BMI) 40.0 and over, adult: Secondary | ICD-10-CM

## 2021-09-03 DIAGNOSIS — F988 Other specified behavioral and emotional disorders with onset usually occurring in childhood and adolescence: Secondary | ICD-10-CM

## 2021-09-03 DIAGNOSIS — G4733 Obstructive sleep apnea (adult) (pediatric): Secondary | ICD-10-CM

## 2021-09-03 DIAGNOSIS — Z79899 Other long term (current) drug therapy: Secondary | ICD-10-CM

## 2021-09-03 MED ORDER — LISDEXAMFETAMINE DIMESYLATE 30 MG PO CAPS: 30.0000 mg | ORAL_CAPSULE | Freq: Every day | ORAL | 0 refills | Status: DC

## 2021-09-03 MED ORDER — TESTOSTERONE CYPIONATE 200 MG/ML IM SOLN: 200.0000 mg | INTRAMUSCULAR | 0 refills | Status: DC

## 2021-09-03 MED ORDER — WEGOVY 0.25 MG/0.5ML ~~LOC~~ SOAJ: 0.2500 mg | SUBCUTANEOUS | 0 refills | Status: DC

## 2021-09-03 NOTE — Progress Notes (Signed)
? ?Subjective:  ? ? Patient ID: Brandon Baker, male    DOB: [birth year], 41 y.o.   MRN: [id number] ? ?HPI ?Pt is a 41 yo obese male with ADHD, OSA, Hypogonadism who presents to the clinic for medication refills.  ? ?Pt is using his vyvanse and tolerating well. No headaches or increased anxiety. No CP, palpitations or headaches.  ? ?Pt is not using his CPAP because he cannot tolerate the mask. He has tried a few and none work.  ? ?He is taking testosterone shot ever 2 weeks when he remembers. He thinks it helps him because he does feel worse when he needs the shot.  ? ?He wants to lose weight and get healthy. He has not exercising or eating healthy right now.  ? ? ?.. ?Active Ambulatory Problems  ?  Diagnosis Date Noted  ? Tobacco abuse 08/07/2012  ? OSA (obstructive sleep apnea) 03/25/2016  ? Situational depression 06/04/2017  ? Adjustment insomnia 06/04/2017  ? Class 3 severe obesity due to excess calories without serious comorbidity with body mass index (BMI) of 40.0 to 44.9 in adult [facility]) 06/04/2017  ? Elevated ferritin 06/06/2017  ? Vitamin D deficiency 06/06/2017  ? No energy 06/06/2017  ? IFG (impaired fasting glucose) 06/17/2017  ? Hypogonadism in male 10/31/2017  ? Attention deficit disorder (ADD) without hyperactivity 10/31/2017  ? Depression, major, single episode, mild (HCC) 08/24/2018  ? Anxiety 08/24/2018  ? Acute stress reaction 08/24/2018  ? Erectile dysfunction 03/01/2019  ? Elevated blood pressure reading 03/05/2019  ? Hyperlipidemia 12/01/2020  ? ?Resolved Ambulatory Problems  ?  Diagnosis Date Noted  ? Chigger bite 11/24/2014  ? Low back strain 08/01/2015  ? Muscle spasm of back 08/01/2015  ? Midline low back pain without sciatica 08/07/2015  ? Elevated fasting glucose 06/06/2017  ? Decreased testosterone level 06/17/2017  ? ?No Additional Past Medical History  ? ? ? ?Review of Systems ?See HPI.  ?   ?Objective:  ? Physical Exam ?Vitals reviewed.  ?Constitutional:   ?   Appearance: Normal appearance.  He is obese.  ?HENT:  ?   Head: Normocephalic.  ?Cardiovascular:  ?   Rate and Rhythm: Normal rate and regular rhythm.  ?   Pulses: Normal pulses.  ?   Heart sounds: Normal heart sounds.  ?Pulmonary:  ?   Effort: Pulmonary effort is normal.  ?   Breath sounds: Normal breath sounds.  ?Neurological:  ?   General: No focal deficit present.  ?   Mental Status: He is alert and oriented to person, place, and time.  ?Psychiatric:     ?   Mood and Affect: Mood normal.  ? ? ? ? ? ?   ?Assessment & Plan:  ?..Brandon Baker was seen today for hypertension and adhd. ? ?Diagnoses and all orders for this visit: ? ?Hypogonadism in male ?-     testosterone cypionate (DEPOTESTOSTERONE CYPIONATE) 200 MG/ML injection; Inject 1 mL (200 mg total) into the muscle every 14 (fourteen) days. ?-     COMPLETE METABOLIC PANEL WITH GFR ?-     Testosterone Total,Free,Bio, Males ?-     CBC with Differential/Platelet ? ?Attention deficit disorder (ADD) without hyperactivity ?-     lisdexamfetamine (VYVANSE) 30 MG capsule; Take 1 capsule (30 mg total) by mouth daily. ?-     lisdexamfetamine (VYVANSE) 30 MG capsule; Take 1 capsule (30 mg total) by mouth daily. ?-     lisdexamfetamine (VYVANSE) 30 MG capsule; Take 1 capsule (30 mg total)  by mouth daily. ? ?OSA (obstructive sleep apnea) ?-     Ambulatory referral to Sleep Studies ? ?Medication management ?-     COMPLETE METABOLIC PANEL WITH GFR ?-     CBC with Differential/Platelet ? ?Class 3 severe obesity due to excess calories without serious comorbidity with body mass index (BMI) of 40.0 to 44.9 in adult [facility]) ?-     WEGOVY 0.25 MG/0.5ML SOAJ; Inject 0.25 mg into the skin once a week. Use this dose for 1 month (4 shots) and then increase to next higher dose. ? ?Labs ordered for testosterone recheck ?Refills sent ?Discussed once weekly 100mg  testosterone shots to be easier to remember ? ?Vyvanse refilled for 3 months.  ? ?BP not to goal.  ?Discussed lifestyle changes to include diet, exercise, wearing CPAP  machine.  ?Low salt diet.  ?If not to goal by next visit will need to start medication.  ? ? .Discussed low carb diet with 1500 calories and 80g of protein.  ?Exercising at least 150 minutes a week.  ?My Fitness Pal could be a Marland Kitchen.  ?Discussed options ?Sent wegovy. Discussed titration up and side effects ?Follow up in 3 months ? ?Pt is not using CPAP and tried different mask. He cannot tolerate the air pressure.  ?He could be good candidate for inspire. ?Referral placed.  ? ?

## 2021-09-03 NOTE — Patient Instructions (Signed)
Managing Your Hypertension Hypertension, also called high blood pressure, is when the force of the blood pressing against the walls of the arteries is too strong. Arteries are blood vessels that carry blood from your heart throughout your body. Hypertension forces the heart to work harder to pump blood and may cause the arteries to become narrow or stiff. Understanding blood pressure readings A blood pressure reading includes a higher number over a lower number: The first, or top, number is called the systolic pressure. It is a measure of the pressure in your arteries as your heart beats. The second, or bottom number, is called the diastolic pressure. It is a measure of the pressure in your arteries as the heart relaxes. For most people, a normal blood pressure is below 120/80. Your personal target blood pressure may vary depending on your medical conditions, your age, and other factors. Blood pressure is classified into four stages. Based on your blood pressure reading, your health care provider may use the following stages to determine what type of treatment you need, if any. Systolic pressure and diastolic pressure are measured in a unit called millimeters of mercury (mmHg). Normal Systolic pressure: below 120. Diastolic pressure: below 80. Elevated Systolic pressure: 120-129. Diastolic pressure: below 80. Hypertension stage 1 Systolic pressure: 130-139. Diastolic pressure: 80-89. Hypertension stage 2 Systolic pressure: 140 or above. Diastolic pressure: 90 or above. How can this condition affect me? Managing your hypertension is very important. Over time, hypertension can damage the arteries and decrease blood flow to parts of the body, including the brain, heart, and kidneys. Having untreated or uncontrolled hypertension can lead to: A heart attack. A stroke. A weakened blood vessel (aneurysm). Heart failure. Kidney damage. Eye damage. Memory and concentration problems. Vascular  dementia. What actions can I take to manage this condition? Hypertension can be managed by making lifestyle changes and possibly by taking medicines. Your health care provider will help you make a plan to bring your blood pressure within a normal range. You may be referred for counseling on a healthy diet and physical activity. Nutrition  Eat a diet that is high in fiber and potassium, and low in salt (sodium), added sugar, and fat. An example eating plan is called the DASH diet. DASH stands for Dietary Approaches to Stop Hypertension. To eat this way: Eat plenty of fresh fruits and vegetables. Try to fill one-half of your plate at each meal with fruits and vegetables. Eat whole grains, such as whole-wheat pasta, brown rice, or whole-grain bread. Fill about one-fourth of your plate with whole grains. Eat low-fat dairy products. Avoid fatty cuts of meat, processed or cured meats, and poultry with skin. Fill about one-fourth of your plate with lean proteins such as fish, chicken without skin, beans, eggs, and tofu. Avoid pre-made and processed foods. These tend to be higher in sodium, added sugar, and fat. Reduce your daily sodium intake. Many people with hypertension should eat less than 1,500 mg of sodium a day. Lifestyle  Work with your health care provider to maintain a healthy body weight or to lose weight. Ask what an ideal weight is for you. Get at least 30 minutes of exercise that causes your heart to beat faster (aerobic exercise) most days of the week. Activities may include walking, swimming, or biking. Include exercise to strengthen your muscles (resistance exercise), such as weight lifting, as part of your weekly exercise routine. Try to do these types of exercises for 30 minutes at least 3 days a week. Do   not use any products that contain nicotine or tobacco. These products include cigarettes, chewing tobacco, and vaping devices, such as e-cigarettes. If you need help quitting, ask your  health care provider. Control any long-term (chronic) conditions you have, such as high cholesterol or diabetes. Identify your sources of stress and find ways to manage stress. This may include meditation, deep breathing, or making time for fun activities. Alcohol use Do not drink alcohol if: Your health care provider tells you not to drink. You are pregnant, may be pregnant, or are planning to become pregnant. If you drink alcohol: Limit how much you have to: 0-1 drink a day for women. 0-2 drinks a day for men. Know how much alcohol is in your drink. In the [place] one drink equals one 12 oz bottle of beer (355 mL), one 5 oz glass of wine (148 mL), or one 1 oz glass of hard liquor (44 mL). Medicines Your health care provider may prescribe medicine if lifestyle changes are not enough to get your blood pressure under control and if: Your systolic blood pressure is 130 or higher. Your diastolic blood pressure is 80 or higher. Take medicines only as told by your health care provider. Follow the directions carefully. Blood pressure medicines must be taken as told by your health care provider. The medicine does not work as well when you skip doses. Skipping doses also puts you at risk for problems. Monitoring Before you monitor your blood pressure: Do not smoke, drink caffeinated beverages, or exercise within 30 minutes before taking a measurement. Use the bathroom and empty your bladder (urinate). Sit quietly for at least 5 minutes before taking measurements. Monitor your blood pressure at home as told by your health care provider. To do this: Sit with your back straight and supported. Place your feet flat on the floor. Do not cross your legs. Support your arm on a flat surface, such as a table. Make sure your upper arm is at heart level. Each time you measure, take two or three readings one minute apart and record the results. You may also need to have your blood pressure checked regularly by  your health care provider. General information Talk with your health care provider about your diet, exercise habits, and other lifestyle factors that may be contributing to hypertension. Review all the medicines you take with your health care provider because there may be side effects or interactions. Keep all follow-up visits. Your health care provider can help you create and adjust your plan for managing your high blood pressure. Where to find more information National Heart, Lung, and Blood Institute: www.nhlbi.nih.gov American Heart Association: www.heart.org Contact a health care provider if: You think you are having a reaction to medicines you have taken. You have repeated (recurrent) headaches. You feel dizzy. You have swelling in your ankles. You have trouble with your vision. Get help right away if: You develop a severe headache or confusion. You have unusual weakness or numbness, or you feel faint. You have severe pain in your chest or abdomen. You vomit repeatedly. You have trouble breathing. These symptoms may be an emergency. Get help right away. Call 911. Do not wait to see if the symptoms will go away. Do not drive yourself to the hospital. Summary Hypertension is when the force of blood pumping through your arteries is too strong. If this condition is not controlled, it may put you at risk for serious complications. Your personal target blood pressure may vary depending on your medical conditions,   your age, and other factors. For most people, a normal blood pressure is less than 120/80. Hypertension is managed by lifestyle changes, medicines, or both. Lifestyle changes to help manage hypertension include losing weight, eating a healthy, low-sodium diet, exercising more, stopping smoking, and limiting alcohol. This information is not intended to replace advice given to you by your health care provider. Make sure you discuss any questions you have with your health care  provider. Document Revised: 01/04/2021 Document Reviewed: 01/04/2021 Elsevier Patient Education  2023 Elsevier Inc.  

## 2021-09-04 ENCOUNTER — Telehealth: Payer: Self-pay

## 2021-09-04 ENCOUNTER — Encounter: Payer: Self-pay | Admitting: Physician Assistant

## 2021-09-04 LAB — CBC WITH DIFFERENTIAL/PLATELET
Absolute Monocytes: 592 {cells}/uL (ref 200–950)
Basophils Absolute: 41 {cells}/uL (ref 0–200)
Basophils Relative: 0.6 %
Eosinophils Absolute: 122 {cells}/uL (ref 15–500)
Eosinophils Relative: 1.8 %
HCT: 49.5 % (ref 38.5–50.0)
Hemoglobin: 17.2 g/dL — ABNORMAL HIGH (ref 13.2–17.1)
Lymphs Abs: 1965 {cells}/uL (ref 850–3900)
MCH: 31.1 pg (ref 27.0–33.0)
MCHC: 34.7 g/dL (ref 32.0–36.0)
MCV: 89.5 fL (ref 80.0–100.0)
MPV: 10 fL (ref 7.5–12.5)
Monocytes Relative: 8.7 %
Neutro Abs: 4080 {cells}/uL (ref 1500–7800)
Neutrophils Relative %: 60 %
Platelets: 233 Thousand/uL (ref 140–400)
RBC: 5.53 Million/uL (ref 4.20–5.80)
RDW: 13.1 % (ref 11.0–15.0)
Total Lymphocyte: 28.9 %
WBC: 6.8 Thousand/uL (ref 3.8–10.8)

## 2021-09-04 LAB — COMPLETE METABOLIC PANEL WITHOUT GFR
AG Ratio: 1.6 (calc) (ref 1.0–2.5)
ALT: 53 U/L — ABNORMAL HIGH (ref 9–46)
AST: 26 U/L (ref 10–40)
Albumin: 4.1 g/dL (ref 3.6–5.1)
Alkaline phosphatase (APISO): 80 U/L (ref 36–130)
BUN: 14 mg/dL (ref 7–25)
CO2: 29 mmol/L (ref 20–32)
Calcium: 9.4 mg/dL (ref 8.6–10.3)
Chloride: 103 mmol/L (ref 98–110)
Creat: 1.05 mg/dL (ref 0.60–1.29)
Globulin: 2.6 g/dL (ref 1.9–3.7)
Glucose, Bld: 133 mg/dL — ABNORMAL HIGH (ref 65–99)
Potassium: 4.2 mmol/L (ref 3.5–5.3)
Sodium: 140 mmol/L (ref 135–146)
Total Bilirubin: 0.3 mg/dL (ref 0.2–1.2)
Total Protein: 6.7 g/dL (ref 6.1–8.1)
eGFR: 91 mL/min/1.73m2

## 2021-09-04 LAB — TESTOSTERONE TOTAL,FREE,BIO, MALES
Albumin: 4.1 g/dL (ref 3.6–5.1)
Sex Hormone Binding: 26 nmol/L (ref 10–50)
Testosterone: 142 ng/dL — ABNORMAL LOW (ref 250–827)

## 2021-09-04 NOTE — Progress Notes (Signed)
Testosterone is low. When was your last shot?  ?ALT up a little bit.  ?Kidneys look good.

## 2021-09-04 NOTE — Telephone Encounter (Signed)
Initiated Prior authorization [id number] 0.25MG /0.5ML auto-injectors ?Via: Covermymeds ?Case/Key: [id number] ?Status: denied as of 09/04/21 ?Reason: The requested medication and/or diagnosis are not a covered benefit and excluded from coverage in ?accordance with the terms and conditions of your plan benefit. Therefore, the request has been ?administratively denied ?Notified Pt via: Mychart  ?

## 2021-10-18 ENCOUNTER — Ambulatory Visit: Payer: 59 | Admitting: Medical-Surgical

## 2021-10-18 ENCOUNTER — Encounter: Payer: Self-pay | Admitting: Medical-Surgical

## 2021-10-18 VITALS — BP 143/89 | HR 97 | Temp 98.4°F | Ht 70.0 in | Wt 292.0 lb

## 2021-10-18 DIAGNOSIS — J029 Acute pharyngitis, unspecified: Secondary | ICD-10-CM

## 2021-10-18 DIAGNOSIS — J020 Streptococcal pharyngitis: Secondary | ICD-10-CM | POA: Diagnosis not present

## 2021-10-18 LAB — POCT RAPID STREP A (OFFICE): Rapid Strep A Screen: POSITIVE — AB

## 2021-10-18 MED ORDER — AMOXICILLIN-POT CLAVULANATE 875-125 MG PO TABS: 1.0000 | ORAL_TABLET | Freq: Two times a day (BID) | ORAL | 0 refills | Status: DC

## 2021-10-18 NOTE — Progress Notes (Signed)
Established Patient Office Visit  Subjective   Patient ID: Brandon Baker, male   DOB: March 11, 1981 Age: 41 y.o. MRN: [id number]   Chief Complaint  Patient presents with   Sore Throat   Sinusitis    HPI Pleasant 41 year old male presenting today with reports of 3 days of rhinorrhea, postnasal drip, sore throat, and intermittent nonproductive cough.  Notes his postnasal drip and sinus congestion are worse when lying down and makes it hard to sleep at night.  No fevers, chills, shortness of breath, chest pain, or GI symptoms.  Has a 57-year-old child has had no known exposures to illness.  Works from home.  Does not eat or drink after others.    Objective:    Vitals:   10/18/21 1608  BP: (!) 143/89  Pulse: 97  Temp: 98.4 F (36.9 C)  Height: 5\' 10"  (1.778 m)  Weight: 292 lb (132.5 kg)  SpO2: 96%  TempSrc: Oral  BMI (Calculated): 41.9   Physical Exam Vitals reviewed.  Constitutional:      General: He is not in acute distress.    Appearance: Normal appearance. He is obese. He is not ill-appearing.  HENT:     Head: Normocephalic and atraumatic.     Right Ear: A middle ear effusion is present.     Left Ear: A middle ear effusion is present.     Mouth/Throat:     Mouth: Mucous membranes are moist. No oral lesions.     Pharynx: Uvula midline. Posterior oropharyngeal erythema present. No pharyngeal swelling or oropharyngeal exudate.     Tonsils: No tonsillar exudate or tonsillar abscesses. 2+ on the right. 2+ on the left.  Eyes:     Conjunctiva/sclera: Conjunctivae normal.     Pupils: Pupils are equal, round, and reactive to light.  Cardiovascular:     Rate and Rhythm: Normal rate and regular rhythm.     Pulses: Normal pulses.  Pulmonary:     Effort: Pulmonary effort is normal. No respiratory distress.  Musculoskeletal:     Cervical back: Neck supple.  Lymphadenopathy:     Cervical: Cervical adenopathy present.  Skin:    General: Skin is warm and dry.  Neurological:      Mental Status: He is alert and oriented to person, place, and time.  Psychiatric:        Mood and Affect: Mood normal.        Behavior: Behavior normal.        Thought Content: Thought content normal.        Judgment: Judgment normal.   No results found for this or any previous visit (from the past 24 hour(s)).     The 10-year ASCVD risk score (Arnett DK, et al., 2019) is: 6.2%   Values used to calculate the score:     Age: 25 years     Sex: Male     Is Non-Hispanic African American: No     Diabetic: No     Tobacco smoker: Yes     Systolic Blood Pressure: 143 mmHg     Is BP treated: No     HDL Cholesterol: 52 mg/dL     Total Cholesterol: 217 mg/dL   Assessment & Plan:   1. Sore throat - POCT rapid strep A  2. Strep pharyngitis Rapid strep test positive. Treating with Augmentin BID x 10 days to cover for potential otitis media. Conservative treatment at home. Offered nasal spray to help with PND, patient declined. Discussed transmission precautions.  Return if symptoms worsen or fail to improve.  ___________________________________________ Clearnce Sorrel, DNP, APRN, FNP-BC [facility] [place]

## 2021-12-10 ENCOUNTER — Encounter: Payer: Self-pay | Admitting: Physician Assistant

## 2021-12-10 ENCOUNTER — Ambulatory Visit (INDEPENDENT_AMBULATORY_CARE_PROVIDER_SITE_OTHER): Payer: 59 | Admitting: Physician Assistant

## 2021-12-10 VITALS — BP 128/80 | HR 96 | Ht 70.0 in | Wt 297.0 lb

## 2021-12-10 DIAGNOSIS — F988 Other specified behavioral and emotional disorders with onset usually occurring in childhood and adolescence: Secondary | ICD-10-CM | POA: Diagnosis not present

## 2021-12-10 DIAGNOSIS — M25521 Pain in right elbow: Secondary | ICD-10-CM

## 2021-12-10 DIAGNOSIS — R200 Anesthesia of skin: Secondary | ICD-10-CM | POA: Diagnosis not present

## 2021-12-10 DIAGNOSIS — F5102 Adjustment insomnia: Secondary | ICD-10-CM | POA: Diagnosis not present

## 2021-12-10 MED ORDER — TRAZODONE HCL 50 MG PO TABS: 25.0000 mg | ORAL_TABLET | Freq: Every evening | ORAL | 1 refills | Status: DC | PRN

## 2021-12-10 MED ORDER — LISDEXAMFETAMINE DIMESYLATE 40 MG PO CAPS: 40.0000 mg | ORAL_CAPSULE | ORAL | 0 refills | Status: DC

## 2021-12-10 NOTE — Patient Instructions (Signed)
Tennis elbow strap 800mg  ibuprofen and ice  Carpal Tunnel Syndrome  Carpal tunnel syndrome is a condition that causes pain, numbness, and weakness in your hand and fingers. The carpal tunnel is a narrow area located on the palm side of your wrist. Repeated wrist motion or certain diseases may cause swelling within the tunnel. This swelling pinches the main nerve in the wrist. The main nerve in the wrist is called the median nerve. What are the causes? This condition may be caused by: Repeated and forceful wrist and hand motions. Wrist injuries. Arthritis. A cyst or tumor in the carpal tunnel. Fluid buildup during pregnancy. Use of tools that vibrate. Sometimes the cause of this condition is not known. What increases the risk? The following factors may make you more likely to develop this condition: Having a job that requires you to repeatedly or forcefully move your wrist or hand or requires you to use tools that vibrate. This may include jobs that involve using computers, working on an , or working with power tools such as [employer]. Being a woman. Having certain conditions, such as: Diabetes. Obesity. An underactive thyroid (hypothyroidism). Kidney failure. Rheumatoid arthritis. What are the signs or symptoms? Symptoms of this condition include: A tingling feeling in your fingers, especially in your thumb, index, and middle fingers. Tingling or numbness in your hand. An aching feeling in your entire arm, especially when your wrist and elbow are bent for a long time. Wrist pain that goes up your arm to your shoulder. Pain that goes down into your palm or fingers. A weak feeling in your hands. You may have trouble grabbing and holding items. Your symptoms may feel worse during the night. How is this diagnosed? This condition is diagnosed with a medical history and physical exam. You may also have tests, including: Electromyogram (EMG). This test measures  electrical signals sent by your nerves into the muscles. Nerve conduction study. This test measures how well electrical signals pass through your nerves. Imaging tests, such as X-rays, ultrasound, and MRI. These tests check for possible causes of your condition. How is this treated? This condition may be treated with: Lifestyle changes. It is important to stop or change the activity that caused your condition. Doing exercise and activities to strengthen and stretch your muscles and tendons (physical therapy). Making lifestyle changes to help with your condition and learning how to do your daily activities safely (occupational therapy). Medicines for pain and inflammation. This may include medicine that is injected into your wrist. A wrist splint or brace. Surgery. Follow these instructions at home: If you have a splint or brace: Wear the splint or brace as told by your health care provider. Remove it only as told by your health care provider. Loosen the splint or brace if your fingers tingle, become numb, or turn cold and blue. Keep the splint or brace clean. If the splint or brace is not waterproof: Do not let it get wet. Cover it with a watertight covering when you take a bath or shower. Managing pain, stiffness, and swelling If directed, put ice on the painful area. To do this: If you have a removeable splint or brace, remove it as told by your health care provider. Put ice in a plastic bag. Place a towel between your skin and the bag or between the splint or brace and the bag. Leave the ice on for 20 minutes, 2-3 times a day. Do not fall asleep with the cold pack on your  skin. Remove the ice if your skin turns bright red. This is very important. If you cannot feel pain, heat, or cold, you have a greater risk of damage to the area. Move your fingers often to reduce stiffness and swelling. General instructions Take over-the-counter and prescription medicines only as told by your health  care provider. Rest your wrist and hand from any activity that may be causing your pain. If your condition is work related, talk with your employer about changes that can be made, such as getting a wrist pad to use while typing. Do any exercises as told by your health care provider, physical therapist, or occupational therapist. Keep all follow-up visits. This is important. Contact a health care provider if: You have new symptoms. Your pain is not controlled with medicines. Your symptoms get worse. Get help right away if: You have severe numbness or tingling in your wrist or hand. Summary Carpal tunnel syndrome is a condition that causes pain, numbness, and weakness in your hand and fingers. It is usually caused by repeated wrist motions. Lifestyle changes and medicines are used to treat carpal tunnel syndrome. Surgery may be recommended. Follow your health care provider's instructions about wearing a splint, resting from activity, keeping follow-up visits, and calling for help. This information is not intended to replace advice given to you by your health care provider. Make sure you discuss any questions you have with your health care provider. Document Revised: 09/02/2019 Document Reviewed: 09/02/2019 Elsevier Patient Education  2023 [employer].

## 2021-12-10 NOTE — Progress Notes (Signed)
Established Patient Office Visit  Subjective   Patient ID: Brandon Baker, male    DOB: May 29, 1980  Age: 41 y.o. MRN: [id number]  Chief Complaint  Patient presents with   Follow-up    HPI Pt is a 41 yo obese male with ADHD and insomnia who presents to the clinic for 3 month follow up.   Pt is doing ok on vyvanse. He feels like not working as well as it once did and would like increase. No side effects. No CP, palpitations, or vision changes. He uses trazodone as needed for sleep.   He is having some intermittent numbness and tingling of both hands. He is working more with his hands. He is having some right lateral elbow pain as well too. He has not tried anything to make better. Pain worse after overuse.    .. Active Ambulatory Problems    Diagnosis Date Noted   Tobacco abuse 08/07/2012   OSA (obstructive sleep apnea) 03/25/2016   Situational depression 06/04/2017   Adjustment insomnia 06/04/2017   Class 3 severe obesity due to excess calories without serious comorbidity with body mass index (BMI) of 40.0 to 44.9 in adult (HCC) 06/04/2017   Elevated ferritin 06/06/2017   Vitamin D deficiency 06/06/2017   No energy 06/06/2017   IFG (impaired fasting glucose) 06/17/2017   Hypogonadism in male 10/31/2017   Attention deficit disorder (ADD) without hyperactivity 10/31/2017   Depression, major, single episode, mild (HCC) 08/24/2018   Anxiety 08/24/2018   Acute stress reaction 08/24/2018   Erectile dysfunction 03/01/2019   Elevated blood pressure reading 03/05/2019   Hyperlipidemia 12/01/2020   Right elbow pain 12/10/2021   Bilateral hand numbness 12/10/2021   Resolved Ambulatory Problems    Diagnosis Date Noted   Chigger bite 11/24/2014   Low back strain 08/01/2015   Muscle spasm of back 08/01/2015   Midline low back pain without sciatica 08/07/2015   Elevated fasting glucose 06/06/2017   Decreased testosterone level 06/17/2017   No Additional Past Medical History     ROS See HPI.    Objective:     BP 128/80   Pulse 96   Ht 5\' 10"  (1.778 m)   Wt 297 lb (134.7 kg)   SpO2 99%   BMI 42.62 kg/m  BP Readings from Last 3 Encounters:  12/10/21 128/80  10/18/21 (!) 143/89  09/03/21 (!) 152/87   Wt Readings from Last 3 Encounters:  12/10/21 297 lb (134.7 kg)  10/18/21 292 lb (132.5 kg)  09/03/21 (!) 304 lb (137.9 kg)      Physical Exam Vitals reviewed.  Constitutional:      Appearance: Normal appearance. He is obese.  HENT:     Head: Normocephalic.  Neck:     Vascular: No carotid bruit.  Cardiovascular:     Rate and Rhythm: Normal rate and regular rhythm.     Pulses: Normal pulses.  Pulmonary:     Effort: Pulmonary effort is normal.  Musculoskeletal:     Comments: Bilateral phalens and tinels positive Hand grip 5/5 Upper ext strength 5/5 Right lateral elbow tender to palpation NROM of wrist and elbow  Neurological:     General: No focal deficit present.     Mental Status: He is alert and oriented to person, place, and time.  Psychiatric:        Mood and Affect: Mood normal.          Assessment & Plan:  07/01/23Marland KitchenIdus was seen today for follow-up.  Diagnoses and all  orders for this visit:  Attention deficit disorder (ADD) without hyperactivity -     lisdexamfetamine (VYVANSE) 40 MG capsule; Take 1 capsule (40 mg total) by mouth every morning. -     lisdexamfetamine (VYVANSE) 40 MG capsule; Take 1 capsule (40 mg total) by mouth every morning. -     lisdexamfetamine (VYVANSE) 40 MG capsule; Take 1 capsule (40 mg total) by mouth every morning.  Adjustment insomnia -     traZODone (DESYREL) 50 MG tablet; Take 0.5-1 tablets (25-50 mg total) by mouth at bedtime as needed for sleep.  Bilateral hand numbness  Right elbow pain   Vyvanse increased to 40mg   Follow up in 3 months  Refilled trazodone for as needed use for insomnia  Discussed intermittent numbness of hands sounds like carpal tunnel HO given Consider braces  as needed or at least at night Ibuprofen up to 800mg  up to three times a day and ice could also help Wear tennis elbow strap for right elbow pain Follow up as needed    Return in about 3 months (around 03/12/2022), or if symptoms worsen or fail to improve.    , PA-C

## 2022-02-18 ENCOUNTER — Encounter: Payer: Self-pay | Admitting: Physician Assistant

## 2022-02-18 DIAGNOSIS — F988 Other specified behavioral and emotional disorders with onset usually occurring in childhood and adolescence: Secondary | ICD-10-CM

## 2022-02-18 DIAGNOSIS — E291 Testicular hypofunction: Secondary | ICD-10-CM

## 2022-02-18 MED ORDER — LISDEXAMFETAMINE DIMESYLATE 40 MG PO CAPS: 40.0000 mg | ORAL_CAPSULE | ORAL | 0 refills | Status: DC

## 2022-02-18 MED ORDER — TESTOSTERONE CYPIONATE 200 MG/ML IM SOLN: 200.0000 mg | INTRAMUSCULAR | 0 refills | Status: DC

## 2022-04-11 ENCOUNTER — Encounter: Payer: Self-pay | Admitting: Physician Assistant

## 2022-04-11 DIAGNOSIS — F988 Other specified behavioral and emotional disorders with onset usually occurring in childhood and adolescence: Secondary | ICD-10-CM

## 2022-04-11 MED ORDER — AMPHETAMINE-DEXTROAMPHET ER 30 MG PO CP24: 30.0000 mg | ORAL_CAPSULE | ORAL | 0 refills | Status: DC

## 2022-06-18 ENCOUNTER — Other Ambulatory Visit: Payer: Self-pay | Admitting: Physician Assistant

## 2022-06-18 ENCOUNTER — Other Ambulatory Visit: Payer: Self-pay | Admitting: Family Medicine

## 2022-06-18 DIAGNOSIS — E291 Testicular hypofunction: Secondary | ICD-10-CM

## 2022-06-18 DIAGNOSIS — F988 Other specified behavioral and emotional disorders with onset usually occurring in childhood and adolescence: Secondary | ICD-10-CM

## 2022-06-19 MED ORDER — AMPHETAMINE-DEXTROAMPHET ER 30 MG PO CP24: 30.0000 mg | ORAL_CAPSULE | ORAL | 0 refills | Status: DC

## 2022-06-19 MED ORDER — TESTOSTERONE CYPIONATE 200 MG/ML IM SOLN: 200.0000 mg | INTRAMUSCULAR | 0 refills | Status: DC

## 2022-06-19 MED ORDER — "BD DISP NEEDLES 22G X 1-1/2"" MISC": 0 refills | Status: DC

## 2022-06-19 NOTE — Telephone Encounter (Signed)
Please have him schedule appt for next adderall refill. Needs to be seen every 6 months.

## 2022-06-19 NOTE — Telephone Encounter (Signed)
Patient returned a call, let him know he needs 6 month follow up appt. Transferred to front desk to schedule.

## 2022-06-19 NOTE — Telephone Encounter (Signed)
Message sent to patient via Mychart to schedule a follow up appointment for next adderall refill.

## 2022-07-03 ENCOUNTER — Ambulatory Visit: Payer: 59 | Admitting: Physician Assistant

## 2022-07-03 ENCOUNTER — Encounter: Payer: Self-pay | Admitting: Physician Assistant

## 2022-07-03 VITALS — BP 144/84 | HR 96 | Ht 70.0 in | Wt 305.4 lb

## 2022-07-03 DIAGNOSIS — E785 Hyperlipidemia, unspecified: Secondary | ICD-10-CM | POA: Diagnosis not present

## 2022-07-03 DIAGNOSIS — F988 Other specified behavioral and emotional disorders with onset usually occurring in childhood and adolescence: Secondary | ICD-10-CM

## 2022-07-03 DIAGNOSIS — E291 Testicular hypofunction: Secondary | ICD-10-CM

## 2022-07-03 DIAGNOSIS — F5102 Adjustment insomnia: Secondary | ICD-10-CM

## 2022-07-03 MED ORDER — AMPHETAMINE-DEXTROAMPHET ER 30 MG PO CP24: 30.0000 mg | ORAL_CAPSULE | ORAL | 0 refills | Status: DC

## 2022-07-03 MED ORDER — ZOLPIDEM TARTRATE 5 MG PO TABS: 5.0000 mg | ORAL_TABLET | Freq: Every evening | ORAL | 2 refills | Status: DC | PRN

## 2022-07-03 MED ORDER — LOSARTAN POTASSIUM 25 MG PO TABS: 25.0000 mg | ORAL_TABLET | Freq: Every day | ORAL | 0 refills | Status: DC

## 2022-07-03 MED ORDER — TRAZODONE HCL 50 MG PO TABS: 100.0000 mg | ORAL_TABLET | Freq: Every evening | ORAL | 0 refills | Status: DC | PRN

## 2022-07-03 NOTE — Patient Instructions (Signed)
Stop trazodone.  Start [place].  Get labs Start cozaar for BP.

## 2022-07-03 NOTE — Progress Notes (Signed)
Established Patient Office Visit  Subjective   Patient ID: Brandon Baker, male    DOB: 12/28/1980  Age: 42 y.o. MRN: [id number]  Chief Complaint  Patient presents with   Follow-up    HPI Pt is a 42 y.o. obese male with ADHD following up for medication refill of Adderall. He is doing ok on Adderall. He feels like it isn't working as well as it used to. No side effects. No CP, palpitations, headaches, or vision changes.   He uses trazadone for sleep and feels it is not working as well as it used to. He says he sleeps 4 hours at night. He doesn't think the Adderall is causing this as he can feel it wearing off between 5-6pm.  He says he is interested in trying another medication but worried about side effects.   Patient says he has been "feeling off lately". He is under a lot of stressors with a new house and work. He says he doesn't make much time for himself. He doesn't have the desire to do things that he used to.   .. Active Ambulatory Problems    Diagnosis Date Noted   Tobacco abuse 08/07/2012   OSA (obstructive sleep apnea) 03/25/2016   Situational depression 06/04/2017   Adjustment insomnia 06/04/2017   Class 3 severe obesity due to excess calories without serious comorbidity with body mass index (BMI) of 40.0 to 44.9 in adult ([place]) 06/04/2017   Elevated ferritin 06/06/2017   Vitamin D deficiency 06/06/2017   No energy 06/06/2017   IFG (impaired fasting glucose) 06/17/2017   Hypogonadism in male 10/31/2017   Attention deficit disorder (ADD) without hyperactivity 10/31/2017   Depression, major, single episode, mild ([place]) 08/24/2018   Anxiety 08/24/2018   Acute stress reaction 08/24/2018   Erectile dysfunction 03/01/2019   Elevated blood pressure reading 03/05/2019   Hyperlipidemia 12/01/2020   Resolved Ambulatory Problems    Diagnosis Date Noted   Chigger bite 11/24/2014   Low back strain 08/01/2015   Muscle spasm of back 08/01/2015   Midline low back pain without  sciatica 08/07/2015   Elevated fasting glucose 06/06/2017   Decreased testosterone level 06/17/2017   Right elbow pain 12/10/2021   Bilateral hand numbness 12/10/2021   No Additional Past Medical History     ROS See HPI   Objective:     BP (!) 144/84   Pulse 96   Ht '5\' 10"'$  (1.778 m)   Wt (!) 305 lb 6.4 oz (138.5 kg)   SpO2 98%   BMI 43.82 kg/m  BP Readings from Last 3 Encounters:  07/03/22 (!) 144/84  12/10/21 128/80  10/18/21 (!) 143/89      Physical Exam Constitutional:      Appearance: He is obese.  HENT:     Head: Normocephalic and atraumatic.  Cardiovascular:     Rate and Rhythm: Normal rate and regular rhythm.  Pulmonary:     Effort: Pulmonary effort is normal.     Breath sounds: Normal breath sounds.  Musculoskeletal:        General: Normal range of motion.  Neurological:     Mental Status: He is alert and oriented to person, place, and time.      The 10-year ASCVD risk score (Arnett DK, et al., 2019) is: 6.3%    Assessment & Plan:   [facility] was seen today for follow-up.  Diagnoses and all orders for this visit:  Attention deficit disorder (ADD) without hyperactivity -     COMPLETE METABOLIC  PANEL WITH GFR -     amphetamine-dextroamphetamine (ADDERALL XR) 30 MG 24 hr capsule; Take 1 capsule (30 mg total) by mouth every morning.  Adjustment insomnia -     COMPLETE METABOLIC PANEL WITH GFR -     traZODone (DESYREL) 50 MG tablet; Take 2 tablets (100 mg total) by mouth at bedtime as needed for sleep.  Hypogonadism in male -     Testosterone Total,Free,Bio, Males -     CBC w/Diff/Platelet -     PSA -     COMPLETE METABOLIC PANEL WITH GFR  Hyperlipidemia, unspecified hyperlipidemia type -     COMPLETE METABOLIC PANEL WITH GFR -     Lipid Panel w/reflex Direct LDL  Other orders -     losartan (COZAAR) 25 MG tablet; Take 1 tablet (25 mg total) by mouth daily. -     Discontinue: zolpidem (AMBIEN) 5 MG tablet; Take 1 tablet (5 mg total) by mouth  at bedtime as needed for sleep. -     amphetamine-dextroamphetamine (ADDERALL XR) 30 MG 24 hr capsule; Take 1 capsule (30 mg total) by mouth every morning. -     amphetamine-dextroamphetamine (ADDERALL XR) 30 MG 24 hr capsule; Take 1 capsule (30 mg total) by mouth every morning.   Sent ambien but patient did not want to take this Increased trazodone to '100mg'$  at bedtime for sleep to try Refilled Adderall for 3 months Start losartan 25 mg for elevated BP, recheck in 2 weeks nurse visit Encouraged to check BP at home  Ordered labs today PHQ-9 today shows moderately severe depression which could be contributing to his lack of focus Discussed counseling, exercise, and healthy diet  Discussed medications for weight loss and depression but patient declines at this time  Will recheck testosterone level to make sure he is on the appropriate dose    Return in about 3 months (around 10/01/2022).

## 2022-07-12 ENCOUNTER — Encounter: Payer: Self-pay | Admitting: Physician Assistant

## 2022-10-07 ENCOUNTER — Ambulatory Visit: Payer: 59 | Admitting: Physician Assistant

## 2022-10-07 ENCOUNTER — Encounter: Payer: Self-pay | Admitting: Physician Assistant

## 2022-10-07 VITALS — BP 100/52 | HR 92 | Ht 70.0 in | Wt 298.0 lb

## 2022-10-07 DIAGNOSIS — G4733 Obstructive sleep apnea (adult) (pediatric): Secondary | ICD-10-CM

## 2022-10-07 DIAGNOSIS — E291 Testicular hypofunction: Secondary | ICD-10-CM

## 2022-10-07 DIAGNOSIS — E785 Hyperlipidemia, unspecified: Secondary | ICD-10-CM

## 2022-10-07 DIAGNOSIS — F988 Other specified behavioral and emotional disorders with onset usually occurring in childhood and adolescence: Secondary | ICD-10-CM | POA: Diagnosis not present

## 2022-10-07 DIAGNOSIS — Z23 Encounter for immunization: Secondary | ICD-10-CM | POA: Diagnosis not present

## 2022-10-07 DIAGNOSIS — E66813 Obesity, class 3: Secondary | ICD-10-CM

## 2022-10-07 LAB — POCT GLYCOSYLATED HEMOGLOBIN (HGB A1C): Hemoglobin A1C: 5.4 % (ref 4.0–5.6)

## 2022-10-07 MED ORDER — AMPHETAMINE-DEXTROAMPHET ER 30 MG PO CP24: 30.0000 mg | ORAL_CAPSULE | ORAL | 0 refills | Status: DC

## 2022-10-07 MED ORDER — NALTREXONE-BUPROPION HCL ER 8-90 MG PO TB12
ORAL_TABLET | ORAL | 0 refills | Status: DC
Start: 2022-10-07 — End: 2023-01-07

## 2022-10-07 NOTE — Patient Instructions (Signed)
Naltrexone; Bupropion Extended-Release Tablets What is this medication? NALTREXONE; BUPROPION (nal TREX one; byoo PROE pee on) promotes weight loss. It may also be used to maintain weight loss. It works by decreasing appetite. Changes to diet and exercise are often combined with this medication. This medicine may be used for other purposes; ask your health care provider or pharmacist if you have questions. COMMON BRAND NAME(S): Contrave What should I tell my care team before I take this medication? They need to know if you have any of these conditions: An eating disorder, such as anorexia or bulimia Diabetes Depression Frequently drink alcohol Glaucoma Head injury Heart disease High blood pressure History of a tumor or infection of your brain or spine History of heart attack or stroke History of irregular heartbeat History of substance use disorder or alcohol use disorder Kidney disease Liver disease Low levels of sodium in the blood Mental health condition Seizures Suicidal thoughts, plans, or attempt by you or a family member Taken an MAOI like Carbex, Eldepryl, Marplan, Nardil, or Parnate in last 14 days An unusual or allergic reaction to bupropion, naltrexone, other medications, foods, dyes, or preservatives Breast-feeding Pregnant or trying to become pregnant How should I use this medication? Take this medication by mouth with a full glass of water. Take it as directed on the prescription label at the same time every day. Do not cut, crush, or chew this medication. Swallow the tablets whole. You can take it with or without food. Do not take with high-fat meals as this may increase your risk of seizures. Keep taking it unless your care team tells you to stop. A special MedGuide will be given to you by the pharmacist with each prescription and refill. Be sure to read this information carefully each time. Talk to your care team about the use of this medication in children. Special  care may be needed. Overdosage: If you think you have taken too much of this medicine contact a poison control center or emergency room at once. NOTE: This medicine is only for you. Do not share this medicine with others. What if I miss a dose? If you miss a dose, skip it. Take your next dose at the normal time. Do not take extra or 2 doses at the same time to make up for the missed dose. What may interact with this medication? Do not take this medication with any of the following: Any medications used to stop taking opioids, such as methadone or buprenorphine Linezolid MAOIs, such as Carbex, Eldepryl, Marplan, Nardil, and Parnate Methylene blue (injected into a vein) Opioid medications Other medications that contain bupropion, such as Zyban or Wellbutrin This medication may also interact with the following: Alcohol Certain medications for blood pressure, such as metoprolol, propranolol Certain medications for depression, anxiety, or mental health conditions Certain medications for HIV or hepatitis Certain medications for irregular heart beat, such as propafenone, flecainide Certain medications for Parkinson disease, such as amantadine, levodopa Certain medications for seizures, such as carbamazepine, phenytoin, phenobarbital Certain medications for sleep Cimetidine Clopidogrel Cyclophosphamide Digoxin Disulfiram Furazolidone Isoniazid Nicotine Orphenadrine Procarbazine Steroid medications, such as prednisone or cortisone Stimulant medications for attention disorders, weight loss, or to stay awake Tamoxifen Theophylline Thiotepa Ticlopidine Tramadol Warfarin This list may not describe all possible interactions. Give your health care provider a list of all the medicines, herbs, non-prescription drugs, or dietary supplements you use. Also tell them if you smoke, drink alcohol, or use illegal drugs. Some items may interact with your medicine. What  should I watch for while using  this medication? Visit your care team for regular checks on your progress. This medication may cause serious skin reactions. They can happen weeks to months after starting the medication. Contact your care team right away if you notice fevers or flu-like symptoms with a rash. The rash may be red or purple and then turn into blisters or peeling of the skin. Or, you might notice a red rash with swelling of the face, lips, or lymph nodes in your neck or under your arms. This medication may affect blood sugar. Ask your care team if changes in diet or medications are needed if you have diabetes. Patients and their families should watch out for new or worsening depression or thoughts of suicide. This includes sudden changes in mood, behaviors, or thoughts. These changes can happen at any time but are more common in the beginning of treatment or after a change in dose. Call your care team right away if you experience these thoughts or worsening depression. Avoid alcoholic drinks while taking this medication. Drinking large amounts of alcoholic beverages, using sleeping or anxiety medications, or quickly stopping the use of these agents while taking this medication may increase your risk for a seizure. Do not drive or use heavy machinery until you know how this medication affects you. This medication can impair your ability to perform these tasks. Inform your care team if you wish to become pregnant or think you might be pregnant. Losing weight while pregnant is not advised and may cause harm to the unborn child. Talk to your care team for more information. What side effects may I notice from receiving this medication? Side effects that you should report to your care team as soon as possible: Allergic reactions--skin rash, itching, hives, swelling of the face, lips, tongue, or throat Fast or irregular heartbeat Increase in blood pressure Liver injury--right upper belly pain, loss of appetite, nausea,  light-colored stool, dark yellow or brown urine, yellowing skin or eyes, unusual weakness or fatigue Mood and behavior changes--anxiety, nervousness, confusion, hallucinations, irritability, hostility, thoughts of suicide or self-harm, worsening mood, feelings of depression Redness, blistering, peeling, or loosening of the skin, including inside the mouth Seizures Sudden eye pain or change in vision such as blurry vision, seeing halos around lights, vision loss Side effects that usually do not require medical attention (report to your care team if they continue or are bothersome): Constipation Dizziness Dry mouth Fatigue Headache Nausea Trouble sleeping Vomiting This list may not describe all possible side effects. Call your doctor for medical advice about side effects. You may report side effects to FDA at 1-800-FDA-1088. Where should I keep my medication? Keep out of the reach of children and pets. Store between 15 and 30 degrees C (59 and 86 degrees F). Get rid of any unused medication after the expiration date. To get rid of medications that are no longer needed or have expired: Take the medication to a medication take-back program. Check with your pharmacy or law enforcement to find a location. If you cannot return the medication, check the label or package insert to see if the medication should be thrown out in the garbage or flushed down the toilet. If you are not sure, ask your care team. If it is safe to put it in the trash, pour the medication out of the container. Mix the medication with cat litter, dirt, coffee grounds, or other unwanted substance. Seal the mixture in a bag or container. Put it in  the trash. NOTE: This sheet is a summary. It may not cover all possible information. If you have questions about this medicine, talk to your doctor, pharmacist, or health care provider.  2024 Elsevier/Gold Standard (2021-06-18 00:00:00)

## 2022-10-07 NOTE — Progress Notes (Signed)
Established Patient Office Visit  Subjective   Patient ID: Brandon Baker, male    DOB: [birth year]  Age: 42 y.o. MRN: [id number]  Chief Complaint  Patient presents with   Follow-up    Wgt management     HPI Patient is a 42 year old obese male with hypogonadism, ADD, hyperlipidemia, OSA who presents to the clinic for medication refills and follow-up.  He is really frustrated with his weight.  For the last 5 years he has actively tried to lose.  He has been on stimulant, tried exercise, tried diet changes with no real benefit.  If he tries really hard to lose a few pounds but then he gains it back.  He did go to a weight loss clinic where he took a shot and appetite suppressants and it worked some but as soon as he went off the diet the weight came back.  He admits he is not using his CPAP.  He does not like the way the mask fits and has not been able to find one that fits.  He is doing well on Adderall for his focus.  No issues with testosterone.  .. Active Ambulatory Problems    Diagnosis Date Noted   Tobacco abuse 08/07/2012   OSA (obstructive sleep apnea) 03/25/2016   Situational depression 06/04/2017   Adjustment insomnia 06/04/2017   Class 3 severe obesity due to excess calories without serious comorbidity with body mass index (BMI) of 40.0 to 44.9 in adult (HCC) 06/04/2017   Elevated ferritin 06/06/2017   Vitamin D deficiency 06/06/2017   No energy 06/06/2017   IFG (impaired fasting glucose) 06/17/2017   Hypogonadism in male 10/31/2017   Attention deficit disorder (ADD) without hyperactivity 10/31/2017   Depression, major, single episode, mild (HCC) 08/24/2018   Anxiety 08/24/2018   Acute stress reaction 08/24/2018   Erectile dysfunction 03/01/2019   Elevated blood pressure reading 03/05/2019   Hyperlipidemia 12/01/2020   Resolved Ambulatory Problems    Diagnosis Date Noted   Chigger bite 11/24/2014   Low back strain 08/01/2015   Muscle spasm of back 08/01/2015    Midline low back pain without sciatica 08/07/2015   Elevated fasting glucose 06/06/2017   Decreased testosterone level 06/17/2017   Right elbow pain 12/10/2021   Bilateral hand numbness 12/10/2021   No Additional Past Medical History     ROS   See HPI.  Objective:     BP (!) 100/52   Pulse 92   Ht 5\' 10"  (1.778 m)   Wt 298 lb (135.2 kg)   SpO2 99%   BMI 42.76 kg/m  BP Readings from Last 3 Encounters:  10/07/22 (!) 100/52  07/03/22 (!) 144/84  12/10/21 128/80   Wt Readings from Last 3 Encounters:  10/07/22 298 lb (135.2 kg)  07/03/22 (!) 305 lb 6.4 oz (138.5 kg)  12/10/21 297 lb (134.7 kg)    ..    10/07/2022   10:54 AM 07/03/2022   11:38 AM 07/03/2022   11:13 AM 12/10/2021    1:19 PM 09/03/2021    8:16 AM  Depression screen PHQ 2/9  Decreased Interest 2 3 0 1 1  Down, Depressed, Hopeless 1 2 0 0 0  PHQ - 2 Score 3 5 0 1 1  Altered sleeping 3 3     Tired, decreased energy 3 3     Change in appetite 2 3     Feeling bad or failure about yourself  1 0     Trouble concentrating 2 2  Moving slowly or fidgety/restless 0 0     Suicidal thoughts 0 0     PHQ-9 Score 14 16     Difficult doing work/chores Somewhat difficult Very difficult      ..    10/07/2022   10:55 AM 07/03/2022   11:39 AM 06/04/2021    8:21 AM 12/01/2020    9:54 AM  GAD 7 : Generalized Anxiety Score  Nervous, Anxious, on Edge 0 2 1 0  Control/stop worrying 0 1 0 0  Worry too much - different things 0 2 0 0  Trouble relaxing 1 3 1 1   Restless 0 0 1 1  Easily annoyed or irritable 2 2 0 1  Afraid - awful might happen 0 0 0 0  Total GAD 7 Score 3 10 3 3   Anxiety Difficulty Not difficult at all Very difficult Not difficult at all Somewhat difficult   .Marland Kitchen Results for orders placed or performed in visit on 10/07/22  POCT HgB A1C  Result Value Ref Range   Hemoglobin A1C 5.4 4.0 - 5.6 %   HbA1c POC (<> result, manual entry)     HbA1c, POC (prediabetic range)     HbA1c, POC (controlled diabetic  range)        Physical Exam Vitals reviewed.  Constitutional:      Appearance: Normal appearance.  HENT:     Head: Normocephalic.  Cardiovascular:     Rate and Rhythm: Normal rate and regular rhythm.  Pulmonary:     Effort: Pulmonary effort is normal.     Breath sounds: Normal breath sounds.  Neurological:     Mental Status: He is alert.        Assessment & Plan:  Marland KitchenMarland KitchenTolga was seen today for follow-up.  Diagnoses and all orders for this visit:  Class 3 severe obesity due to excess calories without serious comorbidity with body mass index (BMI) of 40.0 to 44.9 in adult (HCC) -     POCT HgB A1C -     Naltrexone-buPROPion HCl ER 8-90 MG TB12; 1 tab daily for week 1, then 1 tab BID for week 2, then 2 tab PO qAM and 1 tab PO qPM for week 3, then 2 tabs BID. -     Amb Referral to Bariatric Surgery  Attention deficit disorder (ADD) without hyperactivity -     amphetamine-dextroamphetamine (ADDERALL XR) 30 MG 24 hr capsule; Take 1 capsule (30 mg total) by mouth every morning. -     amphetamine-dextroamphetamine (ADDERALL XR) 30 MG 24 hr capsule; Take 1 capsule (30 mg total) by mouth every morning. -     amphetamine-dextroamphetamine (ADDERALL XR) 30 MG 24 hr capsule; Take 1 capsule (30 mg total) by mouth every morning.  Need for Tdap vaccination -     Tdap vaccine greater than or equal to 7yo IM  Hypogonadism in male -     Amb Referral to Bariatric Surgery  Hyperlipidemia, unspecified hyperlipidemia type -     Amb Referral to Bariatric Surgery  OSA (obstructive sleep apnea) -     Amb Referral to Bariatric Surgery   Adderall refilled for 3 months Follow up in 3 months  Tdap given today.   Marland Kitchen.Discussed low carb diet with 1500 calories and 80g of protein.  Exercising at least 150 minutes a week.  [employer] could be a Chief Technology Officer.  A1C NORMAL. Insurance does not pay for GLP-1 Contrave sent to pharmacy consider sending to mailorder if more than 100 dollars.  SE  discussed  Spent 38 minutes with patient in chart and discussing weight loss/exercise.   [name], PA-C

## 2022-10-08 ENCOUNTER — Encounter: Payer: Self-pay | Admitting: Physician Assistant

## 2022-10-08 LAB — CBC WITH DIFFERENTIAL/PLATELET
Absolute Monocytes: 852 cells/uL (ref 200–950)
Basophils Absolute: 50 cells/uL (ref 0–200)
Basophils Relative: 0.7 %
Eosinophils Absolute: 142 cells/uL (ref 15–500)
Eosinophils Relative: 2 %
HCT: 46.5 % (ref 38.5–50.0)
Hemoglobin: 15.9 g/dL (ref 13.2–17.1)
Lymphs Abs: 1874 cells/uL (ref 850–3900)
MCH: 31.1 pg (ref 27.0–33.0)
MCHC: 34.2 g/dL (ref 32.0–36.0)
MCV: 91 fL (ref 80.0–100.0)
MPV: 10 fL (ref 7.5–12.5)
Monocytes Relative: 12 %
Neutro Abs: 4182 cells/uL (ref 1500–7800)
Neutrophils Relative %: 58.9 %
Platelets: 221 10*3/uL (ref 140–400)
RBC: 5.11 10*6/uL (ref 4.20–5.80)
RDW: 13.1 % (ref 11.0–15.0)
Total Lymphocyte: 26.4 %
WBC: 7.1 10*3/uL (ref 3.8–10.8)

## 2022-10-08 LAB — COMPLETE METABOLIC PANEL WITH GFR
AG Ratio: 1.6 (calc) (ref 1.0–2.5)
ALT: 39 U/L (ref 9–46)
AST: 25 U/L (ref 10–40)
Albumin: 4.2 g/dL (ref 3.6–5.1)
Alkaline phosphatase (APISO): 83 U/L (ref 36–130)
BUN: 12 mg/dL (ref 7–25)
CO2: 28 mmol/L (ref 20–32)
Calcium: 9.6 mg/dL (ref 8.6–10.3)
Chloride: 104 mmol/L (ref 98–110)
Creat: 0.88 mg/dL (ref 0.60–1.29)
Globulin: 2.6 g/dL (calc) (ref 1.9–3.7)
Glucose, Bld: 104 mg/dL — ABNORMAL HIGH (ref 65–99)
Potassium: 4.5 mmol/L (ref 3.5–5.3)
Sodium: 139 mmol/L (ref 135–146)
Total Bilirubin: 0.4 mg/dL (ref 0.2–1.2)
Total Protein: 6.8 g/dL (ref 6.1–8.1)
eGFR: 109 mL/min/{1.73_m2} (ref >=60)

## 2022-10-08 LAB — PSA: PSA: 1.8 ng/mL (ref <=4.00)

## 2022-10-08 LAB — TESTOSTERONE TOTAL,FREE,BIO, MALES
Albumin: 4.2 g/dL (ref 3.6–5.1)
Sex Hormone Binding: 25 nmol/L (ref 10–50)
Testosterone, Bioavailable: 88.3 ng/dL — ABNORMAL LOW (ref 110.0–575.0)
Testosterone, Free: 45.8 pg/mL — ABNORMAL LOW (ref 46.0–224.0)
Testosterone: 286 ng/dL (ref 250–827)

## 2022-10-08 LAB — LIPID PANEL W/REFLEX DIRECT LDL
Cholesterol: 180 mg/dL (ref <200)
HDL: 55 mg/dL (ref >=40)
LDL Cholesterol (Calc): 103 mg/dL (calc) — ABNORMAL HIGH
Non-HDL Cholesterol (Calc): 125 mg/dL (calc) (ref <130)
Total CHOL/HDL Ratio: 3.3 (calc) (ref <5.0)
Triglycerides: 128 mg/dL (ref <150)

## 2022-10-09 ENCOUNTER — Other Ambulatory Visit: Payer: Self-pay | Admitting: Physician Assistant

## 2022-10-09 DIAGNOSIS — F5102 Adjustment insomnia: Secondary | ICD-10-CM

## 2022-10-11 ENCOUNTER — Encounter: Payer: Self-pay | Admitting: Physician Assistant

## 2022-10-11 DIAGNOSIS — E291 Testicular hypofunction: Secondary | ICD-10-CM

## 2022-10-11 MED ORDER — TESTOSTERONE CYPIONATE 200 MG/ML IM SOLN: 250.0000 mg | INTRAMUSCULAR | 0 refills | Status: DC

## 2022-10-11 NOTE — Progress Notes (Signed)
Primo,   PSA normal and looks good.  Cholesterol is MUCH better.  Kidney and liver look good.  Testosterone still on low normal side. Do you want to increase just a little and see if helps you feel any better. Your hemoglobin was better today. Could increase to 1.25ML every 14 days. Recheck in 3 months.

## 2022-10-22 ENCOUNTER — Encounter: Payer: Self-pay | Admitting: Physician Assistant

## 2022-11-22 ENCOUNTER — Other Ambulatory Visit: Payer: Self-pay | Admitting: Physician Assistant

## 2022-11-22 DIAGNOSIS — F988 Other specified behavioral and emotional disorders with onset usually occurring in childhood and adolescence: Secondary | ICD-10-CM

## 2022-11-22 NOTE — Telephone Encounter (Signed)
Spoke with patient- should already have a refill of Adderall at pharmacy. He will call and check with them regarding this.

## 2023-01-03 ENCOUNTER — Other Ambulatory Visit: Payer: Self-pay | Admitting: Physician Assistant

## 2023-01-03 DIAGNOSIS — F988 Other specified behavioral and emotional disorders with onset usually occurring in childhood and adolescence: Secondary | ICD-10-CM

## 2023-01-03 MED ORDER — AMPHETAMINE-DEXTROAMPHET ER 30 MG PO CP24
30.0000 mg | ORAL_CAPSULE | ORAL | 0 refills | Status: AC
Start: 2023-01-03 — End: ?

## 2023-01-03 NOTE — Telephone Encounter (Signed)
Requesting rx rf of adderal XR 30mg  Last written 12/07/2022 Last OV 10/07/2022 Upcoming appt 01/07/2023

## 2023-01-07 ENCOUNTER — Encounter: Payer: Self-pay | Admitting: Physician Assistant

## 2023-01-07 ENCOUNTER — Ambulatory Visit: Payer: 59 | Admitting: Physician Assistant

## 2023-01-07 VITALS — BP 132/86 | HR 103 | Temp 97.6°F | Ht 70.0 in | Wt 308.1 lb

## 2023-01-07 DIAGNOSIS — Z6841 Body Mass Index (BMI) 40.0 and over, adult: Secondary | ICD-10-CM

## 2023-01-07 DIAGNOSIS — E291 Testicular hypofunction: Secondary | ICD-10-CM

## 2023-01-07 DIAGNOSIS — G4733 Obstructive sleep apnea (adult) (pediatric): Secondary | ICD-10-CM

## 2023-01-07 DIAGNOSIS — F988 Other specified behavioral and emotional disorders with onset usually occurring in childhood and adolescence: Secondary | ICD-10-CM | POA: Diagnosis not present

## 2023-01-07 MED ORDER — AMPHETAMINE-DEXTROAMPHETAMINE 10 MG PO TABS
ORAL_TABLET | ORAL | 0 refills | Status: DC
Start: 2023-03-08 — End: 2023-04-08

## 2023-01-07 MED ORDER — AMPHETAMINE-DEXTROAMPHETAMINE 10 MG PO TABS
ORAL_TABLET | ORAL | 0 refills | Status: DC
Start: 2023-01-07 — End: 2023-04-08

## 2023-01-07 MED ORDER — AMPHETAMINE-DEXTROAMPHET ER 30 MG PO CP24
30.0000 mg | ORAL_CAPSULE | ORAL | 0 refills | Status: DC
Start: 2023-01-07 — End: 2023-04-08

## 2023-01-07 MED ORDER — AMPHETAMINE-DEXTROAMPHETAMINE 10 MG PO TABS
ORAL_TABLET | ORAL | 0 refills | Status: DC
Start: 2023-02-06 — End: 2023-04-08

## 2023-01-07 MED ORDER — NALTREXONE-BUPROPION HCL ER 8-90 MG PO TB12
ORAL_TABLET | ORAL | 0 refills | Status: DC
Start: 2023-01-07 — End: 2023-09-12

## 2023-01-07 MED ORDER — AMPHETAMINE-DEXTROAMPHET ER 30 MG PO CP24
30.0000 mg | ORAL_CAPSULE | ORAL | 0 refills | Status: DC
Start: 2023-02-06 — End: 2023-04-08

## 2023-01-07 MED ORDER — NALTREXONE-BUPROPION HCL ER 8-90 MG PO TB12
2.0000 | ORAL_TABLET | Freq: Two times a day (BID) | ORAL | 2 refills | Status: DC
Start: 2023-02-06 — End: 2023-09-12

## 2023-01-07 MED ORDER — AMPHETAMINE-DEXTROAMPHET ER 30 MG PO CP24
30.0000 mg | ORAL_CAPSULE | ORAL | 0 refills | Status: DC
Start: 2023-03-08 — End: 2023-04-08

## 2023-01-07 NOTE — Progress Notes (Signed)
Established Patient Office Visit  Subjective   Patient ID: Brandon Baker, male    DOB: 1981-03-26  Age: 42 y.o. MRN: [id number]  Chief Complaint  Patient presents with   Medical Management of Chronic Issues    HPI Pt is a 42 yo obese male with hypogonadism, OSA, ADD, MDD, and HLD who presents to the clinic for follow up.   He was not able to afford contrave at pharmacy.   He is on 1.72mL of testosterone and doing well. No concerns.   His focus is ok. He has problems in afternoons to evening staying focus. He has a lot ongoing fatigue. He is not using CPaP. He has failed many different masks.   Not smoking.   .. Active Ambulatory Problems    Diagnosis Date Noted   Tobacco abuse 08/07/2012   OSA (obstructive sleep apnea) 03/25/2016   Situational depression 06/04/2017   Adjustment insomnia 06/04/2017   Class 3 severe obesity due to excess calories without serious comorbidity with body mass index (BMI) of 40.0 to 44.9 in adult (HCC) 06/04/2017   Elevated ferritin 06/06/2017   Vitamin D deficiency 06/06/2017   No energy 06/06/2017   IFG (impaired fasting glucose) 06/17/2017   Hypogonadism in male 10/31/2017   Attention deficit disorder (ADD) without hyperactivity 10/31/2017   Depression, major, single episode, mild (HCC) 08/24/2018   Anxiety 08/24/2018   Acute stress reaction 08/24/2018   Erectile dysfunction 03/01/2019   Elevated blood pressure reading 03/05/2019   Hyperlipidemia 12/01/2020   Resolved Ambulatory Problems    Diagnosis Date Noted   Chigger bite 11/24/2014   Low back strain 08/01/2015   Muscle spasm of back 08/01/2015   Midline low back pain without sciatica 08/07/2015   Elevated fasting glucose 06/06/2017   Decreased testosterone level 06/17/2017   Right elbow pain 12/10/2021   Bilateral hand numbness 12/10/2021   No Additional Past Medical History     ROS See HPI.    Objective:     BP (!) 152/88   Pulse (!) 103   Temp 97.6 F (36.4 C)  (Oral)   Ht 5\' 10"  (1.778 m)   Wt (!) 308 lb 1.9 oz (139.8 kg)   SpO2 99%   BMI 44.21 kg/m  BP Readings from Last 3 Encounters:  01/07/23 (!) 152/88  10/07/22 (!) 100/52  07/03/22 (!) 144/84   Wt Readings from Last 3 Encounters:  01/07/23 (!) 308 lb 1.9 oz (139.8 kg)  10/07/22 298 lb (135.2 kg)  07/03/22 (!) 305 lb 6.4 oz (138.5 kg)      Physical Exam Constitutional:      Appearance: Normal appearance. He is obese.  HENT:     Head: Normocephalic.  Cardiovascular:     Rate and Rhythm: Normal rate and regular rhythm.     Heart sounds: Normal heart sounds.  Pulmonary:     Effort: Pulmonary effort is normal.     Breath sounds: Normal breath sounds.  Neurological:     General: No focal deficit present.     Mental Status: He is alert and oriented to person, place, and time.  Psychiatric:        Mood and Affect: Mood normal.       The 10-year ASCVD risk score (Arnett DK, et al., 2019) is: 1.8%    Assessment & Plan:  Marland KitchenMarland KitchenGeonni was seen today for medical management of chronic issues.  Diagnoses and all orders for this visit:  Hypogonadism in male -     Testosterone  Attention deficit disorder (ADD) without hyperactivity -     amphetamine-dextroamphetamine (ADDERALL XR) 30 MG 24 hr capsule; Take 1 capsule (30 mg total) by mouth every morning. -     amphetamine-dextroamphetamine (ADDERALL XR) 30 MG 24 hr capsule; Take 1 capsule (30 mg total) by mouth every morning. -     amphetamine-dextroamphetamine (ADDERALL XR) 30 MG 24 hr capsule; Take 1 capsule (30 mg total) by mouth every morning. -     amphetamine-dextroamphetamine (ADDERALL) 10 MG tablet; Take one tablet at 2pm. -     amphetamine-dextroamphetamine (ADDERALL) 10 MG tablet; Take one tablet at 2pm. -     amphetamine-dextroamphetamine (ADDERALL) 10 MG tablet; Take one tablet at 2pm.  Class 3 severe obesity due to excess calories without serious comorbidity with body mass index (BMI) of 40.0 to 44.9 in adult (HCC) -      Naltrexone-buPROPion HCl ER 8-90 MG TB12; 1 tab daily for week 1, then 1 tab BID for week 2, then 2 tab PO qAM and 1 tab PO qPM for week 3, then 2 tabs BID. -     Naltrexone-buPROPion HCl ER 8-90 MG TB12; Take 2 tablets by mouth 2 (two) times daily.  OSA (obstructive sleep apnea) -     Naltrexone-buPROPion HCl ER 8-90 MG TB12; 1 tab daily for week 1, then 1 tab BID for week 2, then 2 tab PO qAM and 1 tab PO qPM for week 3, then 2 tabs BID. -     Naltrexone-buPROPion HCl ER 8-90 MG TB12; Take 2 tablets by mouth 2 (two) times daily.   Pt does feel better on increase of testosterone Will recheck labs in one week in between shots  Discussed weight Gained since last visit Agrees to try contrave through [employer] for 3 months which should be cheaper than local pharmacy Strongly encouraged exercise Pt declined bariatric referral Not using CPAP due to mask issues and declines sleep referral to consider inspire  Added 10mg  adderall at 2pm to XR Adderall  Follow up in 3 months  BP not to goal but he was rushed and stressed getting here this morning and did not take medications Recheck at home and will report readings     Return in about 3 months (around 04/08/2023).    [name], PA-C

## 2023-01-08 ENCOUNTER — Encounter: Payer: Self-pay | Admitting: Physician Assistant

## 2023-01-16 ENCOUNTER — Other Ambulatory Visit: Payer: Self-pay | Admitting: Physician Assistant

## 2023-01-16 DIAGNOSIS — F5102 Adjustment insomnia: Secondary | ICD-10-CM

## 2023-02-07 ENCOUNTER — Encounter: Payer: Self-pay | Admitting: Physician Assistant

## 2023-02-07 ENCOUNTER — Other Ambulatory Visit: Payer: Self-pay | Admitting: Physician Assistant

## 2023-04-08 ENCOUNTER — Encounter: Payer: Self-pay | Admitting: Physician Assistant

## 2023-04-08 ENCOUNTER — Ambulatory Visit: Payer: 59 | Admitting: Physician Assistant

## 2023-04-08 VITALS — BP 145/95 | HR 68 | Resp 14 | Ht 70.0 in | Wt 315.6 lb

## 2023-04-08 DIAGNOSIS — E291 Testicular hypofunction: Secondary | ICD-10-CM | POA: Diagnosis not present

## 2023-04-08 DIAGNOSIS — E66813 Obesity, class 3: Secondary | ICD-10-CM

## 2023-04-08 DIAGNOSIS — I10 Essential (primary) hypertension: Secondary | ICD-10-CM | POA: Insufficient documentation

## 2023-04-08 DIAGNOSIS — F988 Other specified behavioral and emotional disorders with onset usually occurring in childhood and adolescence: Secondary | ICD-10-CM

## 2023-04-08 DIAGNOSIS — F5101 Primary insomnia: Secondary | ICD-10-CM | POA: Diagnosis not present

## 2023-04-08 DIAGNOSIS — Z6841 Body Mass Index (BMI) 40.0 and over, adult: Secondary | ICD-10-CM

## 2023-04-08 MED ORDER — LOSARTAN POTASSIUM 50 MG PO TABS
50.0000 mg | ORAL_TABLET | Freq: Every day | ORAL | 0 refills | Status: DC
Start: 2023-04-08 — End: 2023-07-08

## 2023-04-08 MED ORDER — AMPHETAMINE-DEXTROAMPHETAMINE 10 MG PO TABS
ORAL_TABLET | ORAL | 0 refills | Status: DC
Start: 2023-04-08 — End: 2023-07-08

## 2023-04-08 MED ORDER — AMPHETAMINE-DEXTROAMPHETAMINE 10 MG PO TABS
ORAL_TABLET | ORAL | 0 refills | Status: DC
Start: 2023-06-07 — End: 2023-07-08

## 2023-04-08 MED ORDER — AMPHETAMINE-DEXTROAMPHET ER 30 MG PO CP24
30.0000 mg | ORAL_CAPSULE | ORAL | 0 refills | Status: DC
Start: 2023-06-07 — End: 2023-07-08

## 2023-04-08 MED ORDER — AMPHETAMINE-DEXTROAMPHET ER 30 MG PO CP24
30.0000 mg | ORAL_CAPSULE | ORAL | 0 refills | Status: DC
Start: 2023-04-08 — End: 2023-05-19

## 2023-04-08 MED ORDER — AMPHETAMINE-DEXTROAMPHET ER 30 MG PO CP24
30.0000 mg | ORAL_CAPSULE | ORAL | 0 refills | Status: DC
Start: 2023-05-08 — End: 2023-07-08

## 2023-04-08 MED ORDER — TRAZODONE HCL 100 MG PO TABS: ORAL_TABLET | ORAL | 1 refills | Status: DC

## 2023-04-08 MED ORDER — AMPHETAMINE-DEXTROAMPHETAMINE 10 MG PO TABS
ORAL_TABLET | ORAL | 0 refills | Status: DC
Start: 2023-05-08 — End: 2023-07-08

## 2023-04-08 NOTE — Patient Instructions (Signed)
For sleep: trazodone 100mg  to 200mg  at bedtime with magnesium 400mg .   Continue adderall.   Increase cozaar to 50mg .

## 2023-04-08 NOTE — Progress Notes (Signed)
Established Patient Office Visit  Subjective   Patient ID: Brandon Baker, male    DOB: 08-16-1980  Age: 42 y.o. MRN: [id number]  Chief Complaint  Patient presents with   Hypertension    Follow up    Hypertension Associated symptoms include malaise/fatigue. Pertinent negatives include no chest pain or shortness of breath.    Pt is a 42 yo obese male with hypogonadism, OSA, ADD, MDD, and HLD who presents to the clinic for follow up.   His focus has not improved since adding 10 mg Adderall at 2 pm to XR Adderall, but he is feeling less tired in the afternoon. He does not want to increase dosage for afternoon Adderall due to trouble with sleep.  He has gained 7 lbs in the past 3 months. He has been using Contrave via [employer].  Patient reports trouble falling asleep, but he can stay asleep well. He goes to bed around 9-11p and wakes up at 5 am. He has tried CPAPs and different masks, but all of the masks make him sleep worse. He is on trazodone, but that is not helping much. Trazodone does not make him feel groggy in the morning.   Elevated BP today. At home Bps average 140/85. Sometimes feels fatigued and lightheaded but takes BP when feels this way and his BP is his usual. Denies CP and SOB.  He is on 1.70mL of testosterone and doing well. No concerns. He is getting testosterone level drawn today.    Review of Systems  Constitutional:  Positive for malaise/fatigue.  Respiratory:  Negative for shortness of breath.   Cardiovascular:  Negative for chest pain.  Psychiatric/Behavioral:  The patient has insomnia.       Objective:     BP (!) 145/95   Pulse 68   Resp 14   Ht 5\' 10"  (1.778 m)   Wt (!) 315 lb 9.6 oz (143.2 kg)   SpO2 98%   BMI 45.28 kg/m  BP Readings from Last 3 Encounters:  04/08/23 (!) 145/95  01/08/23 132/86  10/07/22 (!) 100/52   Wt Readings from Last 3 Encounters:  04/08/23 (!) 315 lb 9.6 oz (143.2 kg)  01/07/23 (!) 308 lb 1.9 oz (139.8 kg)  10/07/22  298 lb (135.2 kg)    .Marland Kitchen    04/08/2023    9:10 AM 01/07/2023    9:16 AM 10/07/2022   10:54 AM 07/03/2022   11:38 AM 07/03/2022   11:13 AM  Depression screen PHQ 2/9  Decreased Interest 3 3 2 3  0  Down, Depressed, Hopeless 2 1 1 2  0  PHQ - 2 Score 5 4 3 5  0  Altered sleeping 3 3 3 3    Tired, decreased energy 3 3 3 3    Change in appetite 1 1 2 3    Feeling bad or failure about yourself  1 1 1  0   Trouble concentrating 1 2 2 2    Moving slowly or fidgety/restless 0 0 0 0   Suicidal thoughts 0 0 0 0   PHQ-9 Score 14 14 14 16    Difficult doing work/chores Very difficult Somewhat difficult Somewhat difficult Very difficult    .Marland Kitchen    04/08/2023    9:11 AM 01/07/2023    9:17 AM 10/07/2022   10:55 AM 07/03/2022   11:39 AM  GAD 7 : Generalized Anxiety Score  Nervous, Anxious, on Edge 0 1 0 2  Control/stop worrying 0 0 0 1  Worry too much - different things 0 2  0 2  Trouble relaxing 3 2 1 3   Restless 0 1 0 0  Easily annoyed or irritable 2 1 2 2   Afraid - awful might happen 0 0 0 0  Total GAD 7 Score 5 7 3 10   Anxiety Difficulty Somewhat difficult Somewhat difficult Not difficult at all Very difficult      Physical Exam Cardiovascular:     Rate and Rhythm: Normal rate and regular rhythm.     Heart sounds: Normal heart sounds.  Pulmonary:     Effort: Pulmonary effort is normal.     Breath sounds: Normal breath sounds.     The 10-year ASCVD risk score (Arnett DK, et al., 2019) is: 1.9%    Assessment & Plan:  Marland KitchenMarland KitchenDenardo was seen today for hypertension.  Diagnoses and all orders for this visit:  Attention deficit disorder (ADD) without hyperactivity -     amphetamine-dextroamphetamine (ADDERALL XR) 30 MG 24 hr capsule; Take 1 capsule (30 mg total) by mouth every morning. -     amphetamine-dextroamphetamine (ADDERALL XR) 30 MG 24 hr capsule; Take 1 capsule (30 mg total) by mouth every morning. -     amphetamine-dextroamphetamine (ADDERALL) 10 MG tablet; Take one tablet at 2pm. -      amphetamine-dextroamphetamine (ADDERALL) 10 MG tablet; Take one tablet at 2pm. -     amphetamine-dextroamphetamine (ADDERALL) 10 MG tablet; Take one tablet at 2pm. -     amphetamine-dextroamphetamine (ADDERALL XR) 30 MG 24 hr capsule; Take 1 capsule (30 mg total) by mouth every morning.  Class 3 severe obesity due to excess calories without serious comorbidity with body mass index (BMI) of 40.0 to 44.9 in adult [facility])  Hypogonadism in male -     CMP14+EGFR -     Testosterone  Primary insomnia -     traZODone (DESYREL) 100 MG tablet; Take one to two tablets at bedtime for sleep.  Essential hypertension -     losartan (COZAAR) 50 MG tablet; Take 1 tablet (50 mg total) by mouth daily.     BP not to goal of <130/80 on losartan. Increased losartan to 50 mg. Continue to take at home BPs.  Discussed weight. Gained since last visit and since starting Contrave. Recommended calling insurance company in new year to see if they will cover wegovy/zepbound due to OSA dx.  Not using CPAP due to mask issues. Increased trazodone dosage to 1.5-2 100 mg tablets at night. Recommended magnesium at night. Recommended improving sleep routine via no screens 1 hr before bed time and going to sleep at same time. Should go to sleep no later than 10 pm.  Patient feels well on testosterone. Will check testosterone level.  Patient still not as focused in afternoon with 10 mg adderall at 2pm along with XR adderall, but no changes to medication at this time due to insomnia.  Follow up in 3 months.  Return in about 3 months (around 07/07/2023) for Follow up.    [name], PA-C

## 2023-04-09 LAB — TESTOSTERONE: Testosterone: 126 ng/dL — ABNORMAL LOW (ref 264–916)

## 2023-04-09 LAB — CMP14+EGFR
ALT: 43 [IU]/L (ref 0–44)
AST: 23 [IU]/L (ref 0–40)
Albumin: 4.3 g/dL (ref 4.1–5.1)
Alkaline Phosphatase: 85 [IU]/L (ref 44–121)
BUN/Creatinine Ratio: 14 (ref 9–20)
BUN: 15 mg/dL (ref 6–24)
Bilirubin Total: 0.2 mg/dL (ref 0.0–1.2)
CO2: 27 mmol/L (ref 20–29)
Calcium: 9.7 mg/dL (ref 8.7–10.2)
Chloride: 100 mmol/L (ref 96–106)
Creatinine, Ser: 1.08 mg/dL (ref 0.76–1.27)
Globulin, Total: 2.4 g/dL (ref 1.5–4.5)
Glucose: 107 mg/dL — ABNORMAL HIGH (ref 70–99)
Potassium: 4.6 mmol/L (ref 3.5–5.2)
Sodium: 140 mmol/L (ref 134–144)
Total Protein: 6.7 g/dL (ref 6.0–8.5)
eGFR: 87 mL/min/{1.73_m2} (ref >59)

## 2023-04-09 NOTE — Progress Notes (Signed)
Testosterone low but you were due to shot. You are taking 1.61mL every 14 days correct?

## 2023-05-19 ENCOUNTER — Other Ambulatory Visit: Payer: Self-pay | Admitting: Physician Assistant

## 2023-05-19 DIAGNOSIS — F988 Other specified behavioral and emotional disorders with onset usually occurring in childhood and adolescence: Secondary | ICD-10-CM

## 2023-05-19 MED ORDER — AMPHETAMINE-DEXTROAMPHET ER 30 MG PO CP24: 30.0000 mg | ORAL_CAPSULE | ORAL | 0 refills | Status: DC

## 2023-05-19 NOTE — Telephone Encounter (Signed)
 Requesting rx rf of adderall XR 30mg   Last written 04/08/2023 Last OV 04/08/2023 Upcoming appt 07/08/2023

## 2023-06-05 ENCOUNTER — Encounter: Payer: Self-pay | Admitting: Physician Assistant

## 2023-06-05 DIAGNOSIS — G4733 Obstructive sleep apnea (adult) (pediatric): Secondary | ICD-10-CM

## 2023-06-19 ENCOUNTER — Telehealth: Payer: Self-pay

## 2023-06-19 NOTE — Telephone Encounter (Signed)
Fax confirmation of CPAP order faxed to [facility] along with chart nots and last sleep study

## 2023-07-01 ENCOUNTER — Other Ambulatory Visit: Payer: Self-pay | Admitting: Physician Assistant

## 2023-07-01 DIAGNOSIS — E291 Testicular hypofunction: Secondary | ICD-10-CM

## 2023-07-01 DIAGNOSIS — F988 Other specified behavioral and emotional disorders with onset usually occurring in childhood and adolescence: Secondary | ICD-10-CM

## 2023-07-02 NOTE — Telephone Encounter (Signed)
 Duplicate request for Adderall.  Testosterone Last OV: 04/08/23 Next OV: 07/08/23 Last RF: 10/11/22

## 2023-07-03 ENCOUNTER — Encounter: Payer: Self-pay | Admitting: Physician Assistant

## 2023-07-04 MED ORDER — AMPHETAMINE-DEXTROAMPHET ER 30 MG PO CP24: 30.0000 mg | ORAL_CAPSULE | ORAL | 0 refills | Status: DC

## 2023-07-04 MED ORDER — TESTOSTERONE CYPIONATE 200 MG/ML IM SOLN: 250.0000 mg | INTRAMUSCULAR | 0 refills | Status: DC

## 2023-07-08 ENCOUNTER — Ambulatory Visit: Payer: 59 | Admitting: Physician Assistant

## 2023-07-08 ENCOUNTER — Encounter: Payer: Self-pay | Admitting: Physician Assistant

## 2023-07-08 VITALS — BP 133/84 | HR 93 | Ht 70.0 in | Wt 317.5 lb

## 2023-07-08 DIAGNOSIS — I10 Essential (primary) hypertension: Secondary | ICD-10-CM

## 2023-07-08 DIAGNOSIS — F988 Other specified behavioral and emotional disorders with onset usually occurring in childhood and adolescence: Secondary | ICD-10-CM

## 2023-07-08 DIAGNOSIS — E66813 Obesity, class 3: Secondary | ICD-10-CM

## 2023-07-08 DIAGNOSIS — F5101 Primary insomnia: Secondary | ICD-10-CM

## 2023-07-08 DIAGNOSIS — E291 Testicular hypofunction: Secondary | ICD-10-CM | POA: Diagnosis not present

## 2023-07-08 DIAGNOSIS — Z6841 Body Mass Index (BMI) 40.0 and over, adult: Secondary | ICD-10-CM

## 2023-07-08 DIAGNOSIS — G4733 Obstructive sleep apnea (adult) (pediatric): Secondary | ICD-10-CM | POA: Diagnosis not present

## 2023-07-08 MED ORDER — AMPHETAMINE-DEXTROAMPHETAMINE 10 MG PO TABS: ORAL_TABLET | ORAL | 0 refills | Status: DC

## 2023-07-08 MED ORDER — LOSARTAN POTASSIUM 100 MG PO TABS: 100.0000 mg | ORAL_TABLET | Freq: Every day | ORAL | Status: DC

## 2023-07-08 MED ORDER — AMPHETAMINE-DEXTROAMPHET ER 30 MG PO CP24: 30.0000 mg | ORAL_CAPSULE | ORAL | 0 refills | Status: DC

## 2023-07-08 MED ORDER — AMPHETAMINE-DEXTROAMPHETAMINE 10 MG PO TABS
ORAL_TABLET | ORAL | 0 refills | Status: DC
Start: 2023-09-06 — End: 2024-02-02

## 2023-07-08 MED ORDER — ESZOPICLONE 3 MG PO TABS: 3.0000 mg | ORAL_TABLET | Freq: Every day | ORAL | 2 refills | Status: DC

## 2023-07-08 MED ORDER — AMPHETAMINE-DEXTROAMPHET ER 30 MG PO CP24
30.0000 mg | ORAL_CAPSULE | ORAL | 0 refills | Status: DC
Start: 2023-08-07 — End: 2023-11-19

## 2023-07-08 NOTE — Progress Notes (Signed)
 Established Patient Office Visit  Subjective   Patient ID: Brandon Baker, male    DOB: [birth year]  Age: 43 y.o. MRN: [id number]  CC: follow up for chronic medical conditions  HPI Patient is a 43 yo obese male with hypogonadism, OSA, ADD, MDD, and HLD who presents to the clinic for follow up.   He has not been called by [facility] for CPAP supplies.   He continues to take Adderall XR 30mg  daily with added Adderall 10mg  IR in the afternoons around 2pm. This works well for him.   He is on Cozaar 50mg  for his blood pressure. He Is checking his blood pressures at home and they range from 130-140/80-90 at home. Denies any CP, palpitations, headaches or vision changes.   He has been taking Trazodone 200mg  nightly for sleep. He has trouble falling asleep but not staying asleep. He does not want to try Ambien due to side effects he has heard of.   .. Active Ambulatory Problems    Diagnosis Date Noted   Tobacco abuse 08/07/2012   OSA (obstructive sleep apnea) 03/25/2016   Situational depression 06/04/2017   Adjustment insomnia 06/04/2017   Class 3 severe obesity due to excess calories without serious comorbidity with body mass index (BMI) of 40.0 to 44.9 in adult (HCC) 06/04/2017   Elevated ferritin 06/06/2017   Vitamin D deficiency 06/06/2017   No energy 06/06/2017   IFG (impaired fasting glucose) 06/17/2017   Hypogonadism in male 10/31/2017   Attention deficit disorder (ADD) without hyperactivity 10/31/2017   Depression, major, single episode, mild (HCC) 08/24/2018   Anxiety 08/24/2018   Acute stress reaction 08/24/2018   Erectile dysfunction 03/01/2019   Elevated blood pressure reading 03/05/2019   Hyperlipidemia 12/01/2020   Primary insomnia 04/08/2023   Essential hypertension 04/08/2023   Resolved Ambulatory Problems    Diagnosis Date Noted   Chigger bite 11/24/2014   Low back strain 08/01/2015   Muscle spasm of back 08/01/2015   Midline low back pain without sciatica  08/07/2015   Elevated fasting glucose 06/06/2017   Decreased testosterone level 06/17/2017   Right elbow pain 12/10/2021   Bilateral hand numbness 12/10/2021   No Additional Past Medical History    Review of Systems  Constitutional:  Positive for malaise/fatigue.  Psychiatric/Behavioral:  The patient has insomnia.      Objective:     BP 133/84   Pulse 93   Ht 5\' 10"  (1.778 m)   Wt (!) 317 lb 8 oz (144 kg)   SpO2 96%   BMI 45.56 kg/m  BP Readings from Last 3 Encounters:  07/08/23 133/84  04/08/23 (!) 145/95  01/08/23 132/86   Wt Readings from Last 3 Encounters:  07/08/23 (!) 317 lb 8 oz (144 kg)  04/08/23 (!) 315 lb 9.6 oz (143.2 kg)  01/07/23 (!) 308 lb 1.9 oz (139.8 kg)   SpO2 Readings from Last 3 Encounters:  07/08/23 96%  04/08/23 98%  01/07/23 99%      Physical Exam Constitutional:      Appearance: He is obese.  HENT:     Head: Normocephalic and atraumatic.  Eyes:     Extraocular Movements: Extraocular movements intact.  Cardiovascular:     Rate and Rhythm: Normal rate and regular rhythm.     Pulses: Normal pulses.     Heart sounds: Normal heart sounds.  Pulmonary:     Effort: Pulmonary effort is normal.     Breath sounds: Normal breath sounds.  Musculoskeletal:  General: Normal range of motion.     Cervical back: Normal range of motion.  Skin:    General: Skin is warm.  Neurological:     Mental Status: He is alert.  Psychiatric:        Mood and Affect: Mood normal.        Behavior: Behavior normal.    Last CBC Lab Results  Component Value Date   WBC 7.1 10/07/2022   HGB 15.9 10/07/2022   HCT 46.5 10/07/2022   MCV 91.0 10/07/2022   MCH 31.1 10/07/2022   RDW 13.1 10/07/2022   PLT 221 10/07/2022   Last metabolic panel Lab Results  Component Value Date   GLUCOSE 107 (H) 04/08/2023   NA 140 04/08/2023   K 4.6 04/08/2023   CL 100 04/08/2023   CO2 27 04/08/2023   BUN 15 04/08/2023   CREATININE 1.08 04/08/2023   EGFR 87  04/08/2023   CALCIUM 9.7 04/08/2023   PROT 6.7 04/08/2023   ALBUMIN 4.3 04/08/2023   LABGLOB 2.4 04/08/2023   BILITOT 0.2 04/08/2023   ALKPHOS 85 04/08/2023   AST 23 04/08/2023   ALT 43 04/08/2023   Last lipids Lab Results  Component Value Date   CHOL 180 10/07/2022   HDL 55 10/07/2022   LDLCALC 103 (H) 10/07/2022   TRIG 128 10/07/2022   CHOLHDL 3.3 10/07/2022   Last hemoglobin A1c Lab Results  Component Value Date   HGBA1C 5.4 10/07/2022   The 10-year ASCVD risk score (Arnett DK, et al., 2019) is: 1.7%    Assessment & Plan:  Marland KitchenMarland KitchenPeng was seen today for medical management of chronic issues.  Diagnoses and all orders for this visit:  Attention deficit disorder (ADD) without hyperactivity -     amphetamine-dextroamphetamine (ADDERALL) 10 MG tablet; Take one tablet at 2pm. -     amphetamine-dextroamphetamine (ADDERALL) 10 MG tablet; Take one tablet at 2pm. -     amphetamine-dextroamphetamine (ADDERALL) 10 MG tablet; Take one tablet at 2pm. -     amphetamine-dextroamphetamine (ADDERALL XR) 30 MG 24 hr capsule; Take 1 capsule (30 mg total) by mouth every morning. -     amphetamine-dextroamphetamine (ADDERALL XR) 30 MG 24 hr capsule; Take 1 capsule (30 mg total) by mouth every morning. -     amphetamine-dextroamphetamine (ADDERALL XR) 30 MG 24 hr capsule; Take 1 capsule (30 mg total) by mouth every morning.  Male hypogonadism -     Testosterone -     CBC w/Diff/Platelet  OSA (obstructive sleep apnea)  Class 3 severe obesity due to excess calories without serious comorbidity with body mass index (BMI) of 40.0 to 44.9 in adult [name])  Essential hypertension -     losartan (COZAAR) 100 MG tablet; Take 1 tablet (100 mg total) by mouth daily.  Primary insomnia -     Eszopiclone 3 MG TABS; Take 1 tablet (3 mg total) by mouth at bedtime. Take immediately before bedtime   Gave referral info for CPAP supplies to call and see when he can get set up.   BP improved on 2nd recheck  but if ranging over 140/90 at home increase to 100mg  of cozaar.   Stop Trazodone. Start Lunesta.   Refilled Adderall.   Gave testosterone lab to get checked in between injections.    [name], PA-C

## 2023-07-08 NOTE — Patient Instructions (Addendum)
 Continue contrave for weight loss Continue Adderall for ADHD Trial of lunesta for sleep  Stop trazodone Increase cozaar to 100mg   Get testosterone check in between shots

## 2023-07-09 ENCOUNTER — Encounter: Payer: Self-pay | Admitting: Physician Assistant

## 2023-08-18 ENCOUNTER — Other Ambulatory Visit: Payer: Self-pay | Admitting: Physician Assistant

## 2023-08-18 DIAGNOSIS — F988 Other specified behavioral and emotional disorders with onset usually occurring in childhood and adolescence: Secondary | ICD-10-CM

## 2023-08-18 DIAGNOSIS — E291 Testicular hypofunction: Secondary | ICD-10-CM

## 2023-08-18 MED ORDER — BD DISP NEEDLES 22G X 1-1/2" MISC: 0 refills | Status: AC

## 2023-08-18 MED ORDER — TESTOSTERONE CYPIONATE 200 MG/ML IM SOLN
250.0000 mg | INTRAMUSCULAR | 0 refills | Status: DC
Start: 2023-08-18 — End: 2024-02-02

## 2023-08-18 MED ORDER — BD LUER-LOCK SYRINGE 18G X 1-1/2" 3 ML MISC: 0 refills | Status: AC

## 2023-08-18 NOTE — Telephone Encounter (Signed)
 Requesting rx rf of  Testosterone 200mg /ml Last written 07/04/2023 Syringe with needle Last written 11/12/2019 Needle 22G 1 1/2" Last written 06/19/2022 Adderall XR 30mg  removed from request as already showing refills for this medication in patient chart.

## 2023-09-11 LAB — CBC WITH DIFFERENTIAL/PLATELET
Basophils Absolute: 0.1 10*3/uL (ref 0.0–0.2)
Basos: 1 %
EOS (ABSOLUTE): 0.2 10*3/uL (ref 0.0–0.4)
Eos: 2 %
Hematocrit: 52.3 % — ABNORMAL HIGH (ref 37.5–51.0)
Hemoglobin: 18 g/dL — ABNORMAL HIGH (ref 13.0–17.7)
Immature Grans (Abs): 0 10*3/uL (ref 0.0–0.1)
Immature Granulocytes: 0 %
Lymphocytes Absolute: 2.2 10*3/uL (ref 0.7–3.1)
Lymphs: 23 %
MCH: 31.7 pg (ref 26.6–33.0)
MCHC: 34.4 g/dL (ref 31.5–35.7)
MCV: 92 fL (ref 79–97)
Monocytes Absolute: 0.7 10*3/uL (ref 0.1–0.9)
Monocytes: 8 %
Neutrophils Absolute: 6.4 10*3/uL (ref 1.4–7.0)
Neutrophils: 66 %
Platelets: 258 10*3/uL (ref 150–450)
RBC: 5.67 x10E6/uL (ref 4.14–5.80)
RDW: 12.9 % (ref 11.6–15.4)
WBC: 9.7 10*3/uL (ref 3.4–10.8)

## 2023-09-11 LAB — TESTOSTERONE: Testosterone: 879 ng/dL (ref 264–916)

## 2023-09-12 ENCOUNTER — Encounter: Payer: Self-pay | Admitting: Physician Assistant

## 2023-09-12 ENCOUNTER — Other Ambulatory Visit: Payer: Self-pay | Admitting: Physician Assistant

## 2023-09-12 NOTE — Progress Notes (Signed)
 Testosterone  looks great but your hemoglobin and hematocrit are up! This can be due to smoking, dehydration, and/or testosterone . I would suggest decreasing testosterone  to 1mL every 14 days and then recheck in 3 months. Are you smoking at all now?

## 2023-09-24 ENCOUNTER — Encounter: Payer: Self-pay | Admitting: Physician Assistant

## 2023-10-08 ENCOUNTER — Ambulatory Visit: Admitting: Physician Assistant

## 2023-11-14 ENCOUNTER — Other Ambulatory Visit: Payer: Self-pay | Admitting: Physician Assistant

## 2023-11-14 DIAGNOSIS — I10 Essential (primary) hypertension: Secondary | ICD-10-CM

## 2023-11-17 ENCOUNTER — Encounter: Payer: Self-pay | Admitting: Physician Assistant

## 2023-11-17 DIAGNOSIS — F988 Other specified behavioral and emotional disorders with onset usually occurring in childhood and adolescence: Secondary | ICD-10-CM

## 2023-11-19 ENCOUNTER — Other Ambulatory Visit: Payer: Self-pay

## 2023-11-19 DIAGNOSIS — I10 Essential (primary) hypertension: Secondary | ICD-10-CM

## 2023-11-19 DIAGNOSIS — F988 Other specified behavioral and emotional disorders with onset usually occurring in childhood and adolescence: Secondary | ICD-10-CM

## 2023-11-19 MED ORDER — AMPHETAMINE-DEXTROAMPHETAMINE 10 MG PO TABS
ORAL_TABLET | ORAL | 0 refills | Status: AC
Start: 2024-01-18 — End: ?

## 2023-11-19 MED ORDER — AMPHETAMINE-DEXTROAMPHET ER 30 MG PO CP24: 30.0000 mg | ORAL_CAPSULE | ORAL | 0 refills | Status: DC

## 2023-11-19 MED ORDER — AMPHETAMINE-DEXTROAMPHETAMINE 10 MG PO TABS: ORAL_TABLET | ORAL | 0 refills | Status: DC

## 2023-11-19 MED ORDER — LOSARTAN POTASSIUM 100 MG PO TABS: 100.0000 mg | ORAL_TABLET | Freq: Every day | ORAL | Status: DC

## 2024-02-02 ENCOUNTER — Ambulatory Visit (INDEPENDENT_AMBULATORY_CARE_PROVIDER_SITE_OTHER): Admitting: Physician Assistant

## 2024-02-02 ENCOUNTER — Encounter: Payer: Self-pay | Admitting: Physician Assistant

## 2024-02-02 VITALS — BP 130/90 | HR 90 | Ht 70.0 in | Wt 308.0 lb

## 2024-02-02 DIAGNOSIS — F988 Other specified behavioral and emotional disorders with onset usually occurring in childhood and adolescence: Secondary | ICD-10-CM | POA: Diagnosis not present

## 2024-02-02 DIAGNOSIS — E66813 Obesity, class 3: Secondary | ICD-10-CM

## 2024-02-02 DIAGNOSIS — E291 Testicular hypofunction: Secondary | ICD-10-CM

## 2024-02-02 DIAGNOSIS — Z6841 Body Mass Index (BMI) 40.0 and over, adult: Secondary | ICD-10-CM

## 2024-02-02 DIAGNOSIS — I10 Essential (primary) hypertension: Secondary | ICD-10-CM | POA: Diagnosis not present

## 2024-02-02 DIAGNOSIS — G4733 Obstructive sleep apnea (adult) (pediatric): Secondary | ICD-10-CM

## 2024-02-02 MED ORDER — LOSARTAN POTASSIUM 100 MG PO TABS: 100.0000 mg | ORAL_TABLET | Freq: Every day | ORAL | 3 refills | Status: AC

## 2024-02-02 MED ORDER — AMPHETAMINE-DEXTROAMPHET ER 30 MG PO CP24: 30.0000 mg | ORAL_CAPSULE | ORAL | 0 refills | Status: DC

## 2024-02-02 MED ORDER — AMPHETAMINE-DEXTROAMPHET ER 30 MG PO CP24: 30.0000 mg | ORAL_CAPSULE | ORAL | 0 refills | Status: AC

## 2024-02-02 MED ORDER — TESTOSTERONE CYPIONATE 200 MG/ML IM SOLN: 250.0000 mg | INTRAMUSCULAR | 1 refills | Status: AC

## 2024-02-02 NOTE — Progress Notes (Unsigned)
   Established Patient Office Visit  Subjective   Patient ID: Brandon Baker, male    DOB: 07-15-1980  Age: 43 y.o. MRN: [id number]  Chief Complaint  Patient presents with   Medical Management of Chronic Issues    HPI Pt is a 43 year old male with PMH of ADD, HTN, OSA who presents to the clinic for a routine follow up.  He continues to takr Adderall XR 30mg  daily with Adderall 10mg  IR in the afternoons as needed. He states he uses the IR sparingly. He also admits to sometimes forgetting to take his XR, and he notices a big difference in his focus when he forgets to take it. He denies any CP, headaches, or vision changes.  He needs a refill of Cozaar  100mg . He has not taken the medication for 2 months because he ran out. Denies checking his blood pressure at home since running out of the medication.  He stopped taking Trazodone  nightly for sleep. He states it does not work for him, and his sleep has improved since he stopped drinking alcohol and few months ago. He does not wish to try any other sleep aids at this time. He states he was never called to receive his CPAP.  {History (Optional):23778}  ROS    Objective:     BP (!) 130/90 (BP Location: Left Arm, Patient Position: Sitting, Cuff Size: Normal)   Pulse 90   Ht 5' 10 (1.778 m)   Wt (!) 139.7 kg   SpO2 95%   BMI 44.19 kg/m  {Vitals History (Optional):23777}  Physical Exam   No results found for any visits on 02/02/24.  {Labs (Optional):23779}  The 10-year ASCVD risk score (Arnett DK, et al., 2019) is: 1.8%    Assessment & Plan:   Problem List Items Addressed This Visit       Cardiovascular and Mediastinum   Essential hypertension     Other   Attention deficit disorder (ADD) without hyperactivity - Primary   Other Visit Diagnoses       Male hypogonadism           No follow-ups on file.    [address] Schneider, Student-PA

## 2024-02-03 ENCOUNTER — Encounter: Payer: Self-pay | Admitting: Physician Assistant

## 2024-02-19 NOTE — Progress Notes (Signed)
 Please advise if this task has been completed or still pending. Thanks in advance.

## 2024-03-30 ENCOUNTER — Telehealth: Payer: Self-pay

## 2024-03-30 DIAGNOSIS — G4733 Obstructive sleep apnea (adult) (pediatric): Secondary | ICD-10-CM

## 2024-03-30 DIAGNOSIS — G478 Other sleep disorders: Secondary | ICD-10-CM

## 2024-03-30 DIAGNOSIS — R0683 Snoring: Secondary | ICD-10-CM

## 2024-03-30 DIAGNOSIS — Z6841 Body Mass Index (BMI) 40.0 and over, adult: Secondary | ICD-10-CM

## 2024-03-30 NOTE — Telephone Encounter (Signed)
 Tara with [facility] [phone] calling regarding order received for CPAP machine  States that the office notes that were received stated that patient had a CPAP machine but was not using it.  This makes the patient non- compliant with CPAP and would require a new sleep study because of this  I informed Rexene that Jade Breeback,PA is out of the office for this week but will forward the message for her to review on her  return.   I attempted to contact the patient to discuss whether this was accurate but had to leave a voicemail message requesting a return call to discuss

## 2024-04-05 NOTE — Telephone Encounter (Signed)
 Attempted call again to patient but phone disconnected call.

## 2024-04-06 NOTE — Telephone Encounter (Signed)
 He recently had a study, I thought? When was his last study?

## 2024-04-06 NOTE — Telephone Encounter (Signed)
 Home sleep test sent to [facility].

## 2024-04-06 NOTE — Telephone Encounter (Signed)
 Spoke with patient. He has not had a CPAP machine for the past 3 years.

## 2024-04-08 NOTE — Telephone Encounter (Signed)
 Patient informed . Has already been contacted by [facility] and they are in process of shipping him his home sleep study kit for testing .

## 2024-05-27 ENCOUNTER — Encounter: Payer: Self-pay | Admitting: Physician Assistant

## 2024-05-27 DIAGNOSIS — F988 Other specified behavioral and emotional disorders with onset usually occurring in childhood and adolescence: Secondary | ICD-10-CM

## 2024-05-28 MED ORDER — AMPHETAMINE-DEXTROAMPHET ER 30 MG PO CP24: 30.0000 mg | ORAL_CAPSULE | ORAL | 0 refills | Status: AC

## 2024-05-28 NOTE — Telephone Encounter (Signed)
..  PDMP not reviewed this encounter. No concerns.  Sent refills.  Follow up in 3 months.

## 2024-08-02 ENCOUNTER — Ambulatory Visit: Admitting: Physician Assistant
